# Patient Record
Sex: Female | Born: 1935 | Race: White | Hispanic: No | Smoking: Former smoker
Health system: Southern US, Community
[De-identification: ages and names within clinical notes are randomized; demographics above are authoritative.]

## PROBLEM LIST (undated history)

## (undated) DIAGNOSIS — E785 Hyperlipidemia, unspecified: Secondary | ICD-10-CM

## (undated) DIAGNOSIS — I4891 Unspecified atrial fibrillation: Secondary | ICD-10-CM

## (undated) DIAGNOSIS — I639 Cerebral infarction, unspecified: Secondary | ICD-10-CM

## (undated) DIAGNOSIS — F419 Anxiety disorder, unspecified: Secondary | ICD-10-CM

## (undated) DIAGNOSIS — N189 Chronic kidney disease, unspecified: Secondary | ICD-10-CM

## (undated) DIAGNOSIS — I050 Rheumatic mitral stenosis: Secondary | ICD-10-CM

## (undated) DIAGNOSIS — I509 Heart failure, unspecified: Secondary | ICD-10-CM

## (undated) DIAGNOSIS — I499 Cardiac arrhythmia, unspecified: Secondary | ICD-10-CM

## (undated) DIAGNOSIS — M1990 Unspecified osteoarthritis, unspecified site: Secondary | ICD-10-CM

## (undated) DIAGNOSIS — F329 Major depressive disorder, single episode, unspecified: Secondary | ICD-10-CM

## (undated) DIAGNOSIS — F32A Depression, unspecified: Secondary | ICD-10-CM

## (undated) DIAGNOSIS — I10 Essential (primary) hypertension: Secondary | ICD-10-CM

## (undated) HISTORY — PX: JOINT REPLACEMENT: SHX530

## (undated) HISTORY — PX: HIP FRACTURE SURGERY: SHX118

## (undated) HISTORY — DX: Heart failure, unspecified: I50.9

## (undated) HISTORY — PX: ABDOMINAL HYSTERECTOMY: SHX81

## (undated) HISTORY — PX: FRACTURE SURGERY: SHX138

## (undated) HISTORY — PX: REPLACEMENT TOTAL KNEE BILATERAL: SUR1225

---

## 2013-06-08 ENCOUNTER — Inpatient Hospital Stay: Payer: Self-pay | Admitting: Internal Medicine

## 2013-06-08 LAB — CBC
HCT: 39.1 % (ref 35.0–47.0)
HGB: 13.1 g/dL (ref 12.0–16.0)
MCH: 29.5 pg (ref 26.0–34.0)
MCHC: 33.4 g/dL (ref 32.0–36.0)
MCV: 88 fL (ref 80–100)
Platelet: 241 10*3/uL (ref 150–440)
RBC: 4.44 10*6/uL (ref 3.80–5.20)
RDW: 14 % (ref 11.5–14.5)
WBC: 10 10*3/uL (ref 3.6–11.0)

## 2013-06-08 LAB — COMPREHENSIVE METABOLIC PANEL
ALK PHOS: 68 U/L
ANION GAP: 7 (ref 7–16)
Albumin: 3.9 g/dL (ref 3.4–5.0)
BILIRUBIN TOTAL: 1.1 mg/dL — AB (ref 0.2–1.0)
BUN: 30 mg/dL — ABNORMAL HIGH (ref 7–18)
CHLORIDE: 107 mmol/L (ref 98–107)
Calcium, Total: 9.3 mg/dL (ref 8.5–10.1)
Co2: 25 mmol/L (ref 21–32)
Creatinine: 1.08 mg/dL (ref 0.60–1.30)
EGFR (African American): 56 — ABNORMAL LOW
EGFR (Non-African Amer.): 48 — ABNORMAL LOW
Glucose: 92 mg/dL (ref 65–99)
Osmolality: 283 (ref 275–301)
Potassium: 4.5 mmol/L (ref 3.5–5.1)
SGOT(AST): 37 U/L (ref 15–37)
SGPT (ALT): 28 U/L (ref 12–78)
SODIUM: 139 mmol/L (ref 136–145)
Total Protein: 7.3 g/dL (ref 6.4–8.2)

## 2013-06-08 LAB — APTT
ACTIVATED PTT: 153.4 s — AB (ref 23.6–35.9)
Activated PTT: 29 secs (ref 23.6–35.9)

## 2013-06-08 LAB — PROTIME-INR
INR: 1.2
Prothrombin Time: 14.6 secs (ref 11.5–14.7)

## 2013-06-08 LAB — CK TOTAL AND CKMB (NOT AT ARMC)
CK, Total: 73 U/L
CK-MB: 2.3 ng/mL (ref 0.5–3.6)

## 2013-06-08 LAB — URINALYSIS, COMPLETE
Bilirubin,UR: NEGATIVE
Blood: NEGATIVE
Glucose,UR: NEGATIVE mg/dL (ref 0–75)
Nitrite: NEGATIVE
PROTEIN: NEGATIVE
Ph: 6 (ref 4.5–8.0)
RBC,UR: 5 /HPF (ref 0–5)
Specific Gravity: 1.018 (ref 1.003–1.030)
Squamous Epithelial: 1

## 2013-06-08 LAB — TROPONIN I
TROPONIN-I: 0.26 ng/mL — AB
Troponin-I: 0.21 ng/mL — ABNORMAL HIGH
Troponin-I: 0.24 ng/mL — ABNORMAL HIGH

## 2013-06-08 LAB — TSH: Thyroid Stimulating Horm: 2.06 u[IU]/mL

## 2013-06-09 LAB — CBC WITH DIFFERENTIAL/PLATELET
BASOS ABS: 0.1 10*3/uL (ref 0.0–0.1)
BASOS PCT: 0.8 %
Eosinophil #: 0.2 10*3/uL (ref 0.0–0.7)
Eosinophil %: 1.9 %
HCT: 35.5 % (ref 35.0–47.0)
HGB: 12.1 g/dL (ref 12.0–16.0)
Lymphocyte #: 1 10*3/uL (ref 1.0–3.6)
Lymphocyte %: 11.1 %
MCH: 29.8 pg (ref 26.0–34.0)
MCHC: 34.1 g/dL (ref 32.0–36.0)
MCV: 87 fL (ref 80–100)
Monocyte #: 0.7 x10 3/mm (ref 0.2–0.9)
Monocyte %: 7.1 %
NEUTROS PCT: 79.1 %
Neutrophil #: 7.4 10*3/uL — ABNORMAL HIGH (ref 1.4–6.5)
Platelet: 203 10*3/uL (ref 150–440)
RBC: 4.07 10*6/uL (ref 3.80–5.20)
RDW: 13.6 % (ref 11.5–14.5)
WBC: 9.3 10*3/uL (ref 3.6–11.0)

## 2013-06-09 LAB — BASIC METABOLIC PANEL
Anion Gap: 9 (ref 7–16)
BUN: 25 mg/dL — ABNORMAL HIGH (ref 7–18)
CHLORIDE: 107 mmol/L (ref 98–107)
CO2: 23 mmol/L (ref 21–32)
CREATININE: 0.96 mg/dL (ref 0.60–1.30)
Calcium, Total: 8.6 mg/dL (ref 8.5–10.1)
EGFR (African American): >60
EGFR (Non-African Amer.): 55 — ABNORMAL LOW
Glucose: 111 mg/dL — ABNORMAL HIGH (ref 65–99)
Osmolality: 283 (ref 275–301)
Potassium: 4.1 mmol/L (ref 3.5–5.1)
SODIUM: 139 mmol/L (ref 136–145)

## 2013-06-09 LAB — PROTIME-INR
INR: 1.2
Prothrombin Time: 15 secs — ABNORMAL HIGH (ref 11.5–14.7)

## 2013-06-09 LAB — APTT: Activated PTT: 96 secs — ABNORMAL HIGH (ref 23.6–35.9)

## 2013-11-26 ENCOUNTER — Ambulatory Visit: Payer: Self-pay | Admitting: Orthopedic Surgery

## 2014-06-04 NOTE — H&P (Signed)
PATIENT NAME:  Theresa Carrillo, KOPPEL MR#:  [id number] DATE OF BIRTH:  23-Jan-1930  DATE OF ADMISSION:  06/08/2013  PRIMARY CARE PHYSICIAN: Dr. Candiss Norse with [facility].   PRIMARY CARDIOLOGIST: Dwayne D. Clayborn Bigness, MD, with [facility].   REFERRING EMERGENCY ROOM PHYSICIAN: Lennette Bihari A. Paduchowski, MD  CHIEF COMPLAINT: Weakness and fast heart rate.    HISTORY OF PRESENT ILLNESS: An 81 year old female who is living very active and independent life, has history of high blood pressure, was on Coumadin for some mild valvular stenosis. For the last 2 to 3 days she had complaint of weakness. She says her usual is doing 1 hour exercises every day and she is walking almost 1 mile every day. For the last 2 to 3 days, she is feeling excessively tired and weak that she can even not walk room to room. Mainly, her muscles felt so heavy and tired in her legs that she did not want to walk much. On further questioning, she states that she had some heaviness in her chest which was retrosternal, like pressure, but denies any palpitation or shortness of breath. She denies any injuries or fever or chills. Denies any change in her urinary symptoms. She has overactive bladder and has to go to the bathroom almost every hour or 2.  There is no change in that one. She had a regular scheduled appointment today with Dr. Etta Quill office so she did not call any other doctor and just decided to wait. Today, when she saw Dr. Clayborn Bigness in the office, they found she has tachycardia ranging up to 160 and 170 with atrial fibrillation and so he sent her into the Emergency Room and spoke to the emergency doctor that he will start the patient on hepatin IV drip and will do cardiac catheterization tomorrow. Hospitalist service was contacted to admit the patient.   REVIEW OF SYSTEMS:    CONSTITUTIONAL: Negative for fever. Positive for fatigue and generalized weakness. No pain or weight loss.  EYES: No blurring, double vision, discharge or  redness.  EARS, NOSE, THROAT: No tinnitus, ear pain or hearing loss.  RESPIRATORY: No cough, wheezing, hemoptysis or shortness of breath.  CARDIOVASCULAR: No chest pain, orthopnea, edema, arrhythmia or palpitations.  GASTROINTESTINAL: No nausea, vomiting, diarrhea, abdominal pain.  GENITOURINARY: No dysuria or hematuria. Increased frequency.  ENDOCRINE: No heat or cold intolerance.  SKIN:  No acne or rashes on the skin.  MUSCULOSKELETAL: No pain but has excessive weakness.  NEUROLOGICAL: No numbness. Has generalized weakness and tired. No tremor or vertigo.  PSYCHIATRIC: No anxiety, insomnia, bipolar disorder.   PAST MEDICAL HISTORY:  Hypertension, mitral valve stenosis, on Coumadin.   SOCIAL HISTORY: No smoking, no drinking. No alcohol use. 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 Currently, she is in independent living facility   FAMILY HISTORY: Positive for myocardial infarction in brother at 42 and mother at 56.  HOME MEDICATIONS:  1.  Warfarin 2.5 mg oral tablet once a day.  2.  Vitamin D3 5000 international units  2 times a week.  3.  Vitamin C 500 mg oral tablet once a day.  4.  Multivitamin tablet once a day.  5.  Doxepin 10 mg oral tablet 3 times a day.  6.  Calcium carbonate 1250 mg oral tablet once a day.  7.  Atenolol 50 mg oral tablet once a day.   PHYSICAL EXAMINATION: VITAL SIGNS:  In ER, temperature 98, blood pressure 102/67, heart rate was initially 140  and 150 when presented to the Emergency Room, currently it is 110 to 120 range. She was given 2 times injection of Cardizem 10 mg by ER physician. Oxygen saturation 97% on room air.  GENERAL: The patient is fully alert and oriented to time, place and person. Does not appear in any acute distress.  HEENT: Head and neck atraumatic. Conjunctiva pink. Oral mucosa moist.  NECK: Supple. No JVD.  RESPIRATORY: Bilateral clear and equal air entry.  CARDIOVASCULAR: S1, S2 present, irregular, no  murmur.  ABDOMEN: Soft, nontender. Bowel sounds present. No organomegaly.  SKIN: No rashes.  LEGS: No edema.  NEUROLOGICAL: Power 5 out of 5. Follows commands. Moves all 4 limbs. No tremor.  JOINTS: No swelling or tenderness.  PSYCHIATRIC: Does not appear in any acute psychiatric illness at this time.   LABORATORY, DIAGNOSTIC AND RADIOLOGICAL DATA:   1.  Glucose 92, BUN 30, creatinine 1.08, sodium 139, potassium 4.5, chloride is 107 and CO2 is 25, calcium is 9.3.  2.  Total protein 7.3, albumin 3.9, bilirubin 1.1, alkaline phosphatase 68, SGOT 37, SGPT 28.  3.  Troponin is 0.21, CK-MB is 2.3.  4.  WBC is 10, hemoglobin is 13.1, platelet count is 241, MCV is 88.  5.  INR is 1.2.  6.  Urinalysis is having 2+ leukocyte esterase and 15 WBC, nitrite negative.  7.  Chest x-ray, portable, shows no acute cardiopulmonary disease.  8.  EKG in the ER shows atrial fibrillation with heart rate ranging up to 120.   ASSESSMENT AND PLAN: An 81 year old female with history of hypertension and mitral valve stenosis, on Coumadin therapy, sent to Emergency Room by Dr. Clayborn Bigness for having atrial fibrillation with rapid ventricular response.  1.  Atrial fibrillation with rapid ventricular response. The patient is given injection Cardizem 2 times by ER physician which slowed down heart rate from 140 and 150s to 110.  I will start her on oral Cardizem 30 mg every 6 hours for better and constant heart rate control. Dr. Clayborn Bigness to follow for further management of this issue. I will check TSH level to find out the cause of this.  2.  Elevated troponin. This might be a result of atrial fibrillation with rapid ventricular response. We will admit to telemetry and follow serial troponins. She will be started on hepatin IV drip for atrial fibrillation and cardiac catheterization will be done tomorrow by Dr. Clayborn Bigness.  3.  White blood cells in urine. The patient says she had overactive bladder. She always had WBCs in her urine  and was treated multiple times in the past for urinary tract infection. She saw a specialist and he did cystoscopy and told her that she has overactive bladder and does not need to be treated for urinary tract infection unless she has symptoms. Currently, she denied any symptoms so I would like not to start on any antibiotics at this time.  4.  History of hypertension. Currently blood pressure is under control so I would like not to start on any blood pressure medication at this time. We will continue monitoring.  5.  CODE STATUS:  Full code.   TOTAL TIME SPENT ON THIS ADMISSION: 50 minutes.    ____________________________ Ceasar Lund Anselm Jungling, MD vgv:cs D: 06/08/2013 14:24:00 ET T: 06/08/2013 14:54:40 ET JOB#: [id number]  cc: Ceasar Lund. Anselm Jungling, MD, <Dictator> Dr. Candiss Norse, [facility] MD ELECTRONICALLY SIGNED 06/21/2013 22:15

## 2014-06-04 NOTE — Consult Note (Signed)
PATIENT NAME:  Theresa Carrillo, Theresa Carrillo MR#:  [id number] DATE OF BIRTH:  01/16/1930  DATE OF CONSULTATION:  06/09/2013  CONSULTING PHYSICIAN:  Chau Savell D. Clayborn Bigness, MD  PRIMARY CARE PHYSICIAN: Dr. Candiss Norse.  INDICATION: Unstable angina.    HISTORY OF PRESENT ILLNESS: The patient is an 81 year old female, lives in independent living, has a history (, history of chest pain, angina, weakness, fatigue. She has a history of transient ischemic attack has been on Coumadin and has trouble with her Coumadin dose. The patient started having trouble with angina with any activity. Finally came to the office for evaluation with dyspnea, weakness, fatigue, heart rates of 150 and irregular. The patient was seen in the office by cardiology and then sent to the Emergency Room for evaluation and admission for unstable angina and rapid atrial fibrillation.   REVIEW OF SYSTEMS: No blackout spells or syncope. No nausea or vomiting. No fever. No chills. No sweats. No weight loss, weight gain. No hemoptysis or hematemesis. No bright red blood per rectum. No vision change, hearing change. No sputum production or cough. She has had coagulopathy. She has had trouble with chest pain and chest angina, shortness of breath.   PAST MEDICAL HISTORY: Hypertension, mitral valve disease, transient ischemic attack, hypertension.   SOCIAL HISTORY: Lives alone. 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 Lives independently. No smoking or alcohol consumption.   FAMILY HISTORY: Myocardial infarction.   MEDICATIONS: She was on Coumadin 2.5 daily, vitamin D, vitamin C, multivitamins. She is on doxepin 10 mg 2 times a day, calcium once a day,  atenolol 50 mg a day.   PHYSICAL EXAMINATION: VITAL SIGNS: Blood pressure 110/70, pulse of 140, respiratory rate 16, afebrile.  HEENT: Normocephalic, atraumatic. Pupils equal, reactive to light.  NECK: Supple. No significant JVD, bruits or adenopathy.   LUNGS: Clear to auscultation and percussion. No  significant wheeze, rhonchi, or rale.  HEART: Irregularly irregular. Tachycardic with systolic ejection murmur at the apex. PMI nondisplaced.  ABDOMEN: Benign.  EXTREMITIES: Within normal limits. NEUROLOGIC: Intact.  SKIN EXAM: Normal.   LABORATORIES: Glucose of 92, BUN of 30, creatinine 1.08, sodium 139, potassium 4.5, chloride 107, CO2 25. LFTs negative. Troponin 0.21. White count 10, hemoglobin 13, platelet count 241, INR 1.2. Urinalysis is essentially negative. Chest x-ray negative.   EKG: Rapid atrial fibrillation, rate of about 130, nonspecific findings.   ASSESSMENT:  1. Rapid atrial fibrillation, unstable angina. 2. White cells in the urine.  3. Hypertension.  4. Murmur, mitral valve disease.  5. History of transient ischemic attack.   PLAN: 1. Recommend treatment for atrial fibrillation, rate control with Cardizem. Increase atenolol. Consider antiarrhythmic, place on anticoagulation short-term with heparin in the interim.  2.  No indication for cardioversion at this stage.  3. Unstable angina. Since her INR is okay, would probably consider cardiac catheterization in the morning for evaluation of ischemia with EKG initially with ST segment changes and chest discomfort. It may be all related to her heart rate and atrial fibrillation, but she has had progressive symptoms and unstable angina, should be investigated with a catheterization.  4. Hypertension. Continue hypertension control with atenolol. We will add Cardizem.  5. Consider urine therapy with antibiotics. Consider whether she needs neurology evaluation, but no evidence of a transient ischemic attack at this point. Recommend modest weight loss and exercise. Continue treatment for bladder disorder.   Proceed with cardiac catheterization and base further evaluation on results of study.  ____________________________ Loran Senters Clayborn Bigness, MD ddc:sg D: 06/09/2013  11:28:44 ET T: 06/09/2013 13:02:17 ET JOB#: [id number]  cc: Cristhian Vanhook  D. Clayborn Bigness, MD, <Dictator> Yolonda Kida MD ELECTRONICALLY SIGNED 07/07/2013 16:27

## 2015-11-16 ENCOUNTER — Other Ambulatory Visit: Payer: Self-pay | Admitting: Orthopedic Surgery

## 2015-11-16 DIAGNOSIS — M48062 Spinal stenosis, lumbar region with neurogenic claudication: Secondary | ICD-10-CM

## 2015-11-16 DIAGNOSIS — M47816 Spondylosis without myelopathy or radiculopathy, lumbar region: Secondary | ICD-10-CM

## 2015-11-16 DIAGNOSIS — G9519 Other vascular myelopathies: Secondary | ICD-10-CM

## 2015-11-16 DIAGNOSIS — M5136 Other intervertebral disc degeneration, lumbar region: Secondary | ICD-10-CM

## 2015-11-29 ENCOUNTER — Ambulatory Visit: Payer: Self-pay

## 2015-12-01 ENCOUNTER — Ambulatory Visit
Admission: RE | Admit: 2015-12-01 | Discharge: 2015-12-01 | Disposition: A | Discharge status: Home / Self Care | Payer: Medicare Other | Source: Ambulatory Visit | Attending: Orthopedic Surgery | Admitting: Orthopedic Surgery

## 2015-12-01 ENCOUNTER — Encounter: Payer: Self-pay | Admitting: Radiology

## 2015-12-01 DIAGNOSIS — M5136 Other intervertebral disc degeneration, lumbar region: Secondary | ICD-10-CM

## 2015-12-01 DIAGNOSIS — M5137 Other intervertebral disc degeneration, lumbosacral region: Secondary | ICD-10-CM | POA: Diagnosis not present

## 2015-12-01 DIAGNOSIS — G9519 Other vascular myelopathies: Secondary | ICD-10-CM

## 2015-12-01 DIAGNOSIS — M47816 Spondylosis without myelopathy or radiculopathy, lumbar region: Secondary | ICD-10-CM | POA: Diagnosis not present

## 2015-12-01 DIAGNOSIS — M48062 Spinal stenosis, lumbar region with neurogenic claudication: Secondary | ICD-10-CM | POA: Diagnosis not present

## 2015-12-01 DIAGNOSIS — M4317 Spondylolisthesis, lumbosacral region: Secondary | ICD-10-CM | POA: Insufficient documentation

## 2016-06-26 ENCOUNTER — Emergency Department: Payer: Medicare Other

## 2016-06-26 ENCOUNTER — Inpatient Hospital Stay
Admission: EM | Admit: 2016-06-26 | Discharge: 2016-06-28 | DRG: 291 | Disposition: A | Discharge status: Home / Self Care | Payer: Medicare Other | Attending: Internal Medicine | Admitting: Internal Medicine

## 2016-06-26 DIAGNOSIS — I110 Hypertensive heart disease with heart failure: Principal | ICD-10-CM | POA: Diagnosis present

## 2016-06-26 DIAGNOSIS — Z7901 Long term (current) use of anticoagulants: Secondary | ICD-10-CM

## 2016-06-26 DIAGNOSIS — I5021 Acute systolic (congestive) heart failure: Secondary | ICD-10-CM | POA: Diagnosis present

## 2016-06-26 DIAGNOSIS — E785 Hyperlipidemia, unspecified: Secondary | ICD-10-CM | POA: Diagnosis present

## 2016-06-26 DIAGNOSIS — Z66 Do not resuscitate: Secondary | ICD-10-CM | POA: Diagnosis present

## 2016-06-26 DIAGNOSIS — I482 Chronic atrial fibrillation: Secondary | ICD-10-CM | POA: Diagnosis present

## 2016-06-26 DIAGNOSIS — I10 Essential (primary) hypertension: Secondary | ICD-10-CM | POA: Diagnosis present

## 2016-06-26 DIAGNOSIS — J9601 Acute respiratory failure with hypoxia: Secondary | ICD-10-CM | POA: Diagnosis present

## 2016-06-26 DIAGNOSIS — J189 Pneumonia, unspecified organism: Secondary | ICD-10-CM | POA: Diagnosis present

## 2016-06-26 DIAGNOSIS — I480 Paroxysmal atrial fibrillation: Secondary | ICD-10-CM | POA: Diagnosis present

## 2016-06-26 DIAGNOSIS — F419 Anxiety disorder, unspecified: Secondary | ICD-10-CM | POA: Diagnosis present

## 2016-06-26 DIAGNOSIS — I5033 Acute on chronic diastolic (congestive) heart failure: Secondary | ICD-10-CM | POA: Diagnosis present

## 2016-06-26 DIAGNOSIS — Z8673 Personal history of transient ischemic attack (TIA), and cerebral infarction without residual deficits: Secondary | ICD-10-CM

## 2016-06-26 DIAGNOSIS — J811 Chronic pulmonary edema: Secondary | ICD-10-CM

## 2016-06-26 DIAGNOSIS — I509 Heart failure, unspecified: Secondary | ICD-10-CM

## 2016-06-26 DIAGNOSIS — Z96653 Presence of artificial knee joint, bilateral: Secondary | ICD-10-CM | POA: Diagnosis present

## 2016-06-26 HISTORY — DX: Essential (primary) hypertension: I10

## 2016-06-26 HISTORY — DX: Cerebral infarction, unspecified: I63.9

## 2016-06-26 HISTORY — DX: Unspecified atrial fibrillation: I48.91

## 2016-06-26 HISTORY — DX: Hyperlipidemia, unspecified: E78.5

## 2016-06-26 HISTORY — DX: Anxiety disorder, unspecified: F41.9

## 2016-06-26 LAB — CBC WITH DIFFERENTIAL/PLATELET
Basophils Absolute: 0.1 10*3/uL (ref 0–0.1)
Basophils Relative: 1 %
EOS ABS: 0.1 10*3/uL (ref 0–0.7)
EOS PCT: 1 %
HCT: 35.3 % (ref 35.0–47.0)
Hemoglobin: 12.3 g/dL (ref 12.0–16.0)
LYMPHS ABS: 0.5 10*3/uL — AB (ref 1.0–3.6)
Lymphocytes Relative: 5 %
MCH: 29.4 pg (ref 26.0–34.0)
MCHC: 34.8 g/dL (ref 32.0–36.0)
MCV: 84.5 fL (ref 80.0–100.0)
MONO ABS: 0.7 10*3/uL (ref 0.2–0.9)
MONOS PCT: 7 %
Neutro Abs: 8.6 10*3/uL — ABNORMAL HIGH (ref 1.4–6.5)
Neutrophils Relative %: 86 %
PLATELETS: 193 10*3/uL (ref 150–440)
RBC: 4.17 MIL/uL (ref 3.80–5.20)
RDW: 13.4 % (ref 11.5–14.5)
WBC: 9.9 10*3/uL (ref 3.6–11.0)

## 2016-06-26 LAB — COMPREHENSIVE METABOLIC PANEL
ALT: 14 U/L (ref 14–54)
ANION GAP: 11 (ref 5–15)
AST: 25 U/L (ref 15–41)
Albumin: 4.1 g/dL (ref 3.5–5.0)
Alkaline Phosphatase: 56 U/L (ref 38–126)
BILIRUBIN TOTAL: 1.7 mg/dL — AB (ref 0.3–1.2)
BUN: 19 mg/dL (ref 6–20)
CALCIUM: 8.6 mg/dL — AB (ref 8.9–10.3)
CO2: 26 mmol/L (ref 22–32)
Chloride: 97 mmol/L — ABNORMAL LOW (ref 101–111)
Creatinine, Ser: 0.89 mg/dL (ref 0.44–1.00)
GFR calc Af Amer: >60 mL/min (ref >60)
GFR, EST NON AFRICAN AMERICAN: 57 mL/min — AB (ref >60)
Glucose, Bld: 135 mg/dL — ABNORMAL HIGH (ref 65–99)
Potassium: 3.4 mmol/L — ABNORMAL LOW (ref 3.5–5.1)
Sodium: 134 mmol/L — ABNORMAL LOW (ref 135–145)
TOTAL PROTEIN: 6.8 g/dL (ref 6.5–8.1)

## 2016-06-26 LAB — LACTIC ACID, PLASMA: LACTIC ACID, VENOUS: 1 mmol/L (ref 0.5–1.9)

## 2016-06-26 LAB — TROPONIN I: Troponin I: <0.03 ng/mL (ref <0.03)

## 2016-06-26 LAB — LIPASE, BLOOD: LIPASE: 23 U/L (ref 11–51)

## 2016-06-26 LAB — CK: Total CK: 35 U/L — ABNORMAL LOW (ref 38–234)

## 2016-06-26 LAB — PROTIME-INR
INR: 1.83
PROTHROMBIN TIME: 21.4 s — AB (ref 11.4–15.2)

## 2016-06-26 LAB — BRAIN NATRIURETIC PEPTIDE: B Natriuretic Peptide: 1088 pg/mL — ABNORMAL HIGH (ref 0.0–100.0)

## 2016-06-26 MED ORDER — IPRATROPIUM-ALBUTEROL 0.5-2.5 (3) MG/3ML IN SOLN
3.0000 mL | Freq: Once | RESPIRATORY_TRACT | Status: AC
  Administered 2016-06-26: 3 mL via RESPIRATORY_TRACT
  Filled 2016-06-26: qty 3

## 2016-06-26 MED ORDER — IPRATROPIUM-ALBUTEROL 0.5-2.5 (3) MG/3ML IN SOLN
3.0000 mL | Freq: Once | RESPIRATORY_TRACT | Status: AC
Start: 2016-06-26 — End: 2016-06-26
  Administered 2016-06-26: 3 mL via RESPIRATORY_TRACT
  Filled 2016-06-26: qty 3

## 2016-06-26 NOTE — ED Notes (Signed)
C-collar removed per Dr. Karma Greaser.

## 2016-06-26 NOTE — ED Triage Notes (Signed)
Pt arrives via [facility] from [facility] after a fall. Pt was found on floor, EMS states she doesn't know how she got there. She hit her life alert. Pt states she slid out of bed slowly. Pt c/o of neck and back pain. Pt arrived in c-collar. Pt was wheezing with EMS, they gave 1 albuterol. EMS reports they don't think pt wears oxygen, no signs of oxygen. Pt was 80% on RA, pt placed on 2 L nasal cannula in mid 90's. Hx a fib, HTN. Arrives with DNR. Pt does use blood thinners, is on warfarin.

## 2016-06-27 ENCOUNTER — Inpatient Hospital Stay
Admit: 2016-06-27 | Discharge: 2016-06-27 | Disposition: A | Discharge status: Home / Self Care | Payer: Medicare Other | Attending: Internal Medicine | Admitting: Internal Medicine

## 2016-06-27 ENCOUNTER — Encounter: Payer: Self-pay | Admitting: Internal Medicine

## 2016-06-27 DIAGNOSIS — I5021 Acute systolic (congestive) heart failure: Secondary | ICD-10-CM | POA: Diagnosis present

## 2016-06-27 DIAGNOSIS — J189 Pneumonia, unspecified organism: Secondary | ICD-10-CM | POA: Diagnosis present

## 2016-06-27 DIAGNOSIS — I480 Paroxysmal atrial fibrillation: Secondary | ICD-10-CM | POA: Diagnosis present

## 2016-06-27 DIAGNOSIS — Z96653 Presence of artificial knee joint, bilateral: Secondary | ICD-10-CM | POA: Diagnosis present

## 2016-06-27 DIAGNOSIS — J9601 Acute respiratory failure with hypoxia: Secondary | ICD-10-CM | POA: Diagnosis present

## 2016-06-27 DIAGNOSIS — Z7901 Long term (current) use of anticoagulants: Secondary | ICD-10-CM | POA: Diagnosis not present

## 2016-06-27 DIAGNOSIS — Z8673 Personal history of transient ischemic attack (TIA), and cerebral infarction without residual deficits: Secondary | ICD-10-CM | POA: Diagnosis not present

## 2016-06-27 DIAGNOSIS — Z66 Do not resuscitate: Secondary | ICD-10-CM | POA: Diagnosis present

## 2016-06-27 DIAGNOSIS — F419 Anxiety disorder, unspecified: Secondary | ICD-10-CM | POA: Diagnosis present

## 2016-06-27 DIAGNOSIS — I5033 Acute on chronic diastolic (congestive) heart failure: Secondary | ICD-10-CM | POA: Diagnosis present

## 2016-06-27 DIAGNOSIS — I110 Hypertensive heart disease with heart failure: Secondary | ICD-10-CM | POA: Diagnosis present

## 2016-06-27 DIAGNOSIS — I482 Chronic atrial fibrillation: Secondary | ICD-10-CM | POA: Diagnosis present

## 2016-06-27 DIAGNOSIS — E785 Hyperlipidemia, unspecified: Secondary | ICD-10-CM | POA: Diagnosis present

## 2016-06-27 DIAGNOSIS — I10 Essential (primary) hypertension: Secondary | ICD-10-CM | POA: Diagnosis present

## 2016-06-27 LAB — CBC
HEMATOCRIT: 36.9 % (ref 35.0–47.0)
Hemoglobin: 12.9 g/dL (ref 12.0–16.0)
MCH: 29.6 pg (ref 26.0–34.0)
MCHC: 35 g/dL (ref 32.0–36.0)
MCV: 84.6 fL (ref 80.0–100.0)
Platelets: 199 10*3/uL (ref 150–440)
RBC: 4.37 MIL/uL (ref 3.80–5.20)
RDW: 13.6 % (ref 11.5–14.5)
WBC: 8.6 10*3/uL (ref 3.6–11.0)

## 2016-06-27 LAB — BASIC METABOLIC PANEL
Anion gap: 11 (ref 5–15)
BUN: 18 mg/dL (ref 6–20)
CHLORIDE: 96 mmol/L — AB (ref 101–111)
CO2: 31 mmol/L (ref 22–32)
CREATININE: 0.98 mg/dL (ref 0.44–1.00)
Calcium: 8.8 mg/dL — ABNORMAL LOW (ref 8.9–10.3)
GFR calc non Af Amer: 51 mL/min — ABNORMAL LOW (ref >60)
GFR, EST AFRICAN AMERICAN: 59 mL/min — AB (ref >60)
Glucose, Bld: 107 mg/dL — ABNORMAL HIGH (ref 65–99)
Potassium: 3.1 mmol/L — ABNORMAL LOW (ref 3.5–5.1)
SODIUM: 138 mmol/L (ref 135–145)

## 2016-06-27 LAB — ECHOCARDIOGRAM COMPLETE
Height: 60 in
Weight: 2441.6 oz

## 2016-06-27 LAB — MRSA PCR SCREENING: MRSA by PCR: NEGATIVE

## 2016-06-27 LAB — MAGNESIUM: Magnesium: 1.8 mg/dL (ref 1.7–2.4)

## 2016-06-27 LAB — TSH: TSH: 1.42 u[IU]/mL (ref 0.350–4.500)

## 2016-06-27 MED ORDER — ENOXAPARIN SODIUM 40 MG/0.4ML ~~LOC~~ SOLN: 40.0000 mg | SUBCUTANEOUS | Status: DC

## 2016-06-27 MED ORDER — FUROSEMIDE 10 MG/ML IJ SOLN
40.0000 mg | Freq: Once | INTRAMUSCULAR | Status: AC
  Administered 2016-06-27: 40 mg via INTRAVENOUS
  Filled 2016-06-27: qty 4

## 2016-06-27 MED ORDER — WARFARIN SODIUM 1 MG PO TABS
4.0000 mg | ORAL_TABLET | Freq: Once | ORAL | Status: AC
  Administered 2016-06-27: 4 mg via ORAL
  Filled 2016-06-27: qty 4

## 2016-06-27 MED ORDER — POTASSIUM CHLORIDE CRYS ER 20 MEQ PO TBCR
40.0000 meq | EXTENDED_RELEASE_TABLET | Freq: Every day | ORAL | Status: AC
  Administered 2016-06-27 – 2016-06-28 (×2): 40 meq via ORAL
  Filled 2016-06-27 (×2): qty 2

## 2016-06-27 MED ORDER — DOXEPIN HCL 10 MG PO CAPS
20.0000 mg | ORAL_CAPSULE | Freq: Every day | ORAL | Status: DC
  Administered 2016-06-27: 20 mg via ORAL
  Filled 2016-06-27 (×2): qty 2

## 2016-06-27 MED ORDER — METOPROLOL TARTRATE 25 MG PO TABS
25.0000 mg | ORAL_TABLET | Freq: Two times a day (BID) | ORAL | Status: DC
  Administered 2016-06-27 – 2016-06-28 (×3): 25 mg via ORAL
  Filled 2016-06-27 (×4): qty 1

## 2016-06-27 MED ORDER — ACETAMINOPHEN 325 MG PO TABS: 650.0000 mg | ORAL_TABLET | Freq: Four times a day (QID) | ORAL | Status: DC | PRN

## 2016-06-27 MED ORDER — WARFARIN - PHARMACIST DOSING INPATIENT
Freq: Every day | Status: DC
  Administered 2016-06-27: 17:00:00

## 2016-06-27 MED ORDER — ORAL CARE MOUTH RINSE
15.0000 mL | Freq: Two times a day (BID) | OROMUCOSAL | Status: DC
  Administered 2016-06-27: 15 mL via OROMUCOSAL

## 2016-06-27 MED ORDER — DOXEPIN HCL 10 MG PO CAPS
10.0000 mg | ORAL_CAPSULE | Freq: Two times a day (BID) | ORAL | Status: DC
  Administered 2016-06-27 – 2016-06-28 (×3): 10 mg via ORAL
  Filled 2016-06-27 (×3): qty 1

## 2016-06-27 MED ORDER — ONDANSETRON HCL 4 MG/2ML IJ SOLN: 4.0000 mg | Freq: Four times a day (QID) | INTRAMUSCULAR | Status: DC | PRN

## 2016-06-27 MED ORDER — DOXEPIN HCL 10 MG PO CAPS
10.0000 mg | ORAL_CAPSULE | Freq: Two times a day (BID) | ORAL | Status: DC
  Administered 2016-06-27: 10 mg via ORAL
  Filled 2016-06-27 (×2): qty 1

## 2016-06-27 MED ORDER — ONDANSETRON HCL 4 MG PO TABS: 4.0000 mg | ORAL_TABLET | Freq: Four times a day (QID) | ORAL | Status: DC | PRN

## 2016-06-27 MED ORDER — ACETAMINOPHEN 650 MG RE SUPP: 650.0000 mg | Freq: Four times a day (QID) | RECTAL | Status: DC | PRN

## 2016-06-27 MED ORDER — SOTALOL HCL 80 MG PO TABS
80.0000 mg | ORAL_TABLET | Freq: Two times a day (BID) | ORAL | Status: DC
  Administered 2016-06-27: 80 mg via ORAL
  Filled 2016-06-27 (×2): qty 1

## 2016-06-27 MED ORDER — SOTALOL HCL 80 MG PO TABS
80.0000 mg | ORAL_TABLET | Freq: Every day | ORAL | Status: DC
  Filled 2016-06-27: qty 1

## 2016-06-27 MED ORDER — FUROSEMIDE 10 MG/ML IJ SOLN
20.0000 mg | Freq: Two times a day (BID) | INTRAMUSCULAR | Status: DC
  Administered 2016-06-27 – 2016-06-28 (×2): 20 mg via INTRAVENOUS
  Filled 2016-06-27 (×2): qty 2

## 2016-06-27 MED ORDER — LOSARTAN POTASSIUM 25 MG PO TABS
25.0000 mg | ORAL_TABLET | Freq: Every day | ORAL | Status: DC
  Administered 2016-06-27: 25 mg via ORAL
  Filled 2016-06-27 (×2): qty 1

## 2016-06-27 NOTE — H&P (Signed)
[facility] [place] NAME: Theresa Carrillo    MR#:  [id number]  DATE OF BIRTH:  08-Aug-1929  DATE OF ADMISSION:  06/26/2016  PRIMARY CARE PHYSICIAN: System, Pcp Not In   REQUESTING/REFERRING PHYSICIAN: Karma Greaser, MD  CHIEF COMPLAINT:   Chief Complaint  Patient presents with  . Fall  . Respiratory Distress    HISTORY OF PRESENT ILLNESS:  Theresa Carrillo  is a 83 y.o. female who presents with Shortness of breath, mild lower showing swelling. Patient states that the shortness of breath has been going on for the past 4-5 days. She has no prior diagnosis of heart failure, she does have a diagnosis of A. fib. Here in the ED workup is consistent with acute heart failure, with pulmonary congestion on imaging and BNP greater than thousand. Hospitalists were called for admission.  PAST MEDICAL HISTORY:   Past Medical History:  Diagnosis Date  . Anxiety   . Atrial fibrillation ([place])   . HLD (hyperlipidemia)   . Hypertension   . Stroke [facility])     PAST SURGICAL HISTORY:   Past Surgical History:  Procedure Laterality Date  . ABDOMINAL HYSTERECTOMY    . HIP FRACTURE SURGERY    . REPLACEMENT TOTAL KNEE BILATERAL      SOCIAL HISTORY:   Social History  Substance Use Topics  . Smoking status: Never Smoker  . Smokeless tobacco: Not on file  . Alcohol use No    FAMILY HISTORY:   Family History  Problem Relation Age of Onset  . Heart failure Mother   . Asthma Father     DRUG ALLERGIES:  No Known Allergies  MEDICATIONS AT HOME:   Prior to Admission medications   Medication Sig Start Date End Date Taking? Authorizing Provider  atenolol (TENORMIN) 100 MG tablet Take 1 tablet by mouth daily. 04/20/16   [provider]  doxepin (SINEQUAN) 10 MG capsule Take 1 capsule by mouth 2 (two) times daily. 04/23/16   [provider]  hydrochlorothiazide (HYDRODIURIL) 12.5 MG tablet Take 1 tablet by mouth daily. 05/18/16    [provider]  losartan (COZAAR) 25 MG tablet Take 1 tablet by mouth daily. 05/18/16   [provider]  sotalol (BETAPACE) 80 MG tablet Take 1 tablet by mouth 2 (two) times daily. 04/23/16   [provider]  warfarin (COUMADIN) 1 MG tablet Take 1 tablet by mouth daily. 04/20/16   [provider]  warfarin (COUMADIN) 4 MG tablet  04/20/16   [provider]  zaleplon (SONATA) 5 MG capsule Take 1 capsule by mouth daily. 06/18/16   [provider]    REVIEW OF SYSTEMS:  Review of Systems  Constitutional: Negative for chills, fever, malaise/fatigue and weight loss.  HENT: Negative for ear pain, hearing loss and tinnitus.   Eyes: Negative for blurred vision, double vision, pain and redness.  Respiratory: Positive for cough and shortness of breath. Negative for hemoptysis.   Cardiovascular: Positive for leg swelling. Negative for chest pain, palpitations and orthopnea.  Gastrointestinal: Negative for abdominal pain, constipation, diarrhea, nausea and vomiting.  Genitourinary: Negative for dysuria, frequency and hematuria.  Musculoskeletal: Negative for back pain, joint pain and neck pain.  Skin:       No acne, rash, or lesions  Neurological: Negative for dizziness, tremors, focal weakness and weakness.  Endo/Heme/Allergies: Negative for polydipsia. Does not bruise/bleed easily.  Psychiatric/Behavioral: Negative for depression. The patient is not nervous/anxious and does not have insomnia.  VITAL SIGNS:   Vitals:   06/26/16 2130 06/26/16 2330 06/27/16 0000  BP: (!) 160/85 131/78 134/87  Pulse: 77 91 83  Resp: (!) 21 (!) 22 (!) 22  SpO2: 99% 98% 98%   Wt Readings from Last 3 Encounters:  No data found for Wt    PHYSICAL EXAMINATION:  Physical Exam  Vitals reviewed. Constitutional: She is oriented to person, place, and time. She appears well-developed and well-nourished. No distress.  HENT:  Head: Normocephalic and atraumatic.   Mouth/Throat: Oropharynx is clear and moist.  Eyes: Conjunctivae and EOM are normal. Pupils are equal, round, and reactive to light. No scleral icterus.  Neck: Normal range of motion. Neck supple. No JVD present. No thyromegaly present.  Cardiovascular: Normal rate, regular rhythm and intact distal pulses.  Exam reveals no gallop and no friction rub.   No murmur heard. Respiratory: Effort normal. No respiratory distress. She has wheezes. She has rales.  GI: Soft. Bowel sounds are normal. She exhibits no distension. There is no tenderness.  Musculoskeletal: Normal range of motion. She exhibits edema.  No arthritis, no gout  Lymphadenopathy:    She has no cervical adenopathy.  Neurological: She is alert and oriented to person, place, and time. No cranial nerve deficit.  No dysarthria, no aphasia  Skin: Skin is warm and dry. No rash noted. No erythema.  Psychiatric: She has a normal mood and affect. Her behavior is normal. Judgment and thought content normal.    LABORATORY PANEL:   CBC  Recent Labs Lab 06/26/16 2129  WBC 9.9  HGB 12.3  HCT 35.3  PLT 193   ------------------------------------------------------------------------------------------------------------------  Chemistries   Recent Labs Lab 06/26/16 2129  NA 134*  K 3.4*  CL 97*  CO2 26  GLUCOSE 135*  BUN 19  CREATININE 0.89  CALCIUM 8.6*  AST 25  ALT 14  ALKPHOS 56  BILITOT 1.7*   ------------------------------------------------------------------------------------------------------------------  Cardiac Enzymes  Recent Labs Lab 06/26/16 2129  TROPONINI <0.03   ------------------------------------------------------------------------------------------------------------------  RADIOLOGY:  Ct Head Wo Contrast  Result Date: 06/26/2016 CLINICAL DATA:  Fall, neck pain EXAM: CT HEAD WITHOUT CONTRAST CT CERVICAL SPINE WITHOUT CONTRAST TECHNIQUE: Multidetector CT imaging of the head and cervical spine was  performed following the standard protocol without intravenous contrast. Multiplanar CT image reconstructions of the cervical spine were also generated. COMPARISON:  None. FINDINGS: CT HEAD FINDINGS Brain: No acute territorial infarction, hemorrhage or intracranial mass. Moderate-to-marked global atrophy. Prominent ventricles felt secondary to atrophy. Mild small vessel ischemic changes of the periventricular white matter. Old appearing lacunar infarct in the left basal ganglia. Vascular: No hyperdense vessels.  Carotid artery calcifications. Skull:  No fracture or suspicious bone lesion. Sinuses/Orbits: No acute finding. Other: None CT CERVICAL SPINE FINDINGS Alignment: Reversal of cervical lordosis. Trace retrolisthesis of C4 on C5. Trace anterolisthesis of C3 on C4. Skull base and vertebrae: Craniovertebral junction is intact. Vertebral body heights are maintained. No fracture is seen. Soft tissues and spinal canal: No prevertebral fluid or swelling. No visible canal hematoma. Disc levels: Multilevel degenerative disc changes, moderate severe at C3-C4, C4-C5 and C5-C6. Large posterior disc osteophyte complex at C5-C6 and on the right side at C4-C5. Multilevel bilateral facet arthropathy. Severe bilateral foraminal stenosis at C5-C6 and on the right side at C4-C5. Upper chest: Negative. Other: Carotid artery calcifications. IMPRESSION: 1. No definite CT evidence for acute intracranial abnormality. 2. Reversal of cervical lordosis. No definite acute osseous abnormality. Electronically Signed   By: Madie Reno.D.  On: 06/26/2016 21:44   Ct Cervical Spine Wo Contrast  Result Date: 06/26/2016 CLINICAL DATA:  Fall, neck pain EXAM: CT HEAD WITHOUT CONTRAST CT CERVICAL SPINE WITHOUT CONTRAST TECHNIQUE: Multidetector CT imaging of the head and cervical spine was performed following the standard protocol without intravenous contrast. Multiplanar CT image reconstructions of the cervical spine were also generated.  COMPARISON:  None. FINDINGS: CT HEAD FINDINGS Brain: No acute territorial infarction, hemorrhage or intracranial mass. Moderate-to-marked global atrophy. Prominent ventricles felt secondary to atrophy. Mild small vessel ischemic changes of the periventricular white matter. Old appearing lacunar infarct in the left basal ganglia. Vascular: No hyperdense vessels.  Carotid artery calcifications. Skull:  No fracture or suspicious bone lesion. Sinuses/Orbits: No acute finding. Other: None CT CERVICAL SPINE FINDINGS Alignment: Reversal of cervical lordosis. Trace retrolisthesis of C4 on C5. Trace anterolisthesis of C3 on C4. Skull base and vertebrae: Craniovertebral junction is intact. Vertebral body heights are maintained. No fracture is seen. Soft tissues and spinal canal: No prevertebral fluid or swelling. No visible canal hematoma. Disc levels: Multilevel degenerative disc changes, moderate severe at C3-C4, C4-C5 and C5-C6. Large posterior disc osteophyte complex at C5-C6 and on the right side at C4-C5. Multilevel bilateral facet arthropathy. Severe bilateral foraminal stenosis at C5-C6 and on the right side at C4-C5. Upper chest: Negative. Other: Carotid artery calcifications. IMPRESSION: 1. No definite CT evidence for acute intracranial abnormality. 2. Reversal of cervical lordosis. No definite acute osseous abnormality. Electronically Signed   By: Donavan Foil M.D.   On: 06/26/2016 21:44   Dg Chest Portable 1 View  Result Date: 06/26/2016 CLINICAL DATA:  Fall EXAM: PORTABLE CHEST 1 VIEW COMPARISON:  06/08/2013 FINDINGS: Cardiomegaly with mild central vascular congestion. No focal consolidation or effusion. No pneumothorax. Old appearing right first and second rib deformity. Aortic atherosclerosis. Prominent mitral calcification. IMPRESSION: Moderate cardiomegaly with central vascular congestion. Electronically Signed   By: Donavan Foil M.D.   On: 06/26/2016 21:59    EKG:   Orders placed or performed  during the hospital encounter of 06/26/16  . ED EKG  . ED EKG    IMPRESSION AND PLAN:  Principal Problem:   Acute systolic CHF (congestive heart failure) (HCC) - no prior diagnosis of CHF, workup in the ED today is consistent with the same, we will treat her with IV Lasix, get an echocardiogram and a cardiology consult in the morning Active Problems:   AF (paroxysmal atrial fibrillation) ([place]) - continue home meds   HTN (hypertension) - stable, continue home medications   Anxiety - continue home dose anxiolytics   HLD (hyperlipidemia) - continue home meds  All the records are reviewed and case discussed with ED provider. Management plans discussed with the patient and/or family.  DVT PROPHYLAXIS: SubQ lovenox  GI PROPHYLAXIS: None  ADMISSION STATUS: Inpatient  CODE STATUS: DNR    Code Status Orders        Start     Ordered   06/27/16 0026  Do not attempt resuscitation/DNR  Continuous    Question Answer Comment  In the event of cardiac or respiratory ARREST Do not call a "code blue"   In the event of cardiac or respiratory ARREST Do not perform Intubation, CPR, defibrillation or ACLS   In the event of cardiac or respiratory ARREST Use medication by any route, position, wound care, and other measures to relive pain and suffering. May use oxygen, suction and manual treatment of airway obstruction as needed for comfort.  06/27/16 0025    Code Status History    Date Active Date Inactive Code Status Order ID Comments User Context   This patient has a current code status but no historical code status.    Advance Directive Documentation     Most Recent Value  Type of Advance Directive  Out of facility DNR (pink MOST or yellow form)  Pre-existing out of facility DNR order (yellow form or pink MOST form)  -  "MOST" Form in Place?  -      TOTAL TIME TAKING CARE OF THIS PATIENT: 45 minutes.   Wilena Tyndall [place] 06/27/2016, 12:41 AM  [facility]   Office  [phone]  CC: Primary care physician; System, Pcp Not In  Note:  This document was prepared using Dragon voice recognition software and may include unintentional dictation errors.

## 2016-06-27 NOTE — Progress Notes (Signed)
Pharmacy Note:  83 yo F with Scr 0.98  Est Crcl 36.4 ml/min.  Patient on Sotalol 80 mg po BID.  Per Manufacturer recommendations the dosing for Crcl 36 ml/min would be 80 mg po Q24h.  Discussed with Dr. Benjie Karvonen. Will transition Sotalol to 80 mg PO Q24h. Patient also on Atenolol at home which has been transitioned to Metoprolol as inpatient.  Cardiology consult pending.  Chinita Greenland PharmD Clinical Pharmacist 06/27/2016

## 2016-06-27 NOTE — Progress Notes (Signed)
[facility] NAME: Theresa Carrillo    MR#:  [id number]  DATE OF BIRTH:  [birth year]  SUBJECTIVE:   Patient here with SOB  States Symptoms have improved  REVIEW OF SYSTEMS:    Review of Systems  Constitutional: Negative for fever, chills weight loss HENT: Negative for ear pain, nosebleeds, congestion, facial swelling, rhinorrhea, neck pain, neck stiffness and ear discharge.   Respiratory: Negative for cough, shortness of breath, wheezing  Cardiovascular: Negative for chest pain, palpitations and leg swelling.  Gastrointestinal: Negative for heartburn, abdominal pain, vomiting, diarrhea or consitpation Genitourinary: Negative for dysuria, urgency, frequency, hematuria Musculoskeletal: Negative for back pain or joint pain Neurological: Negative for dizziness, seizures, syncope, focal weakness,  numbness and headaches.  Hematological: Does not bruise/bleed easily.  Psychiatric/Behavioral: Negative for hallucinations, confusion, dysphoric mood    Tolerating Diet: yes      DRUG ALLERGIES:  No Known Allergies  VITALS:  Blood pressure 115/70, pulse 75, temperature 99.2 F (37.3 C), temperature source Oral, resp. rate 16, height 5' (1.524 m), weight 69.2 kg (152 lb 9.6 oz), SpO2 99 %.  PHYSICAL EXAMINATION:  Constitutional: Appears well-developed and well-nourished. No distress. HENT: Normocephalic. Marland Kitchen Oropharynx is clear and moist.  Eyes: Conjunctivae and EOM are normal. PERRLA, no scleral icterus.  Neck: Normal ROM. Neck supple. No JVD. No tracheal deviation. CVS: Irregular, irregular S1/S2 +, no murmurs, no gallops, no carotid bruit.  Pulmonary: Effort and breath sounds normal, no stridor, rhonchi, wheezes, rales.  Abdominal: Soft. BS +,  no distension, tenderness, rebound or guarding.  Musculoskeletal: Normal range of motion. No edema and no tenderness.  Neuro: Alert. CN 2-12 grossly intact. No focal deficits. Skin: Skin is  warm and dry. No rash noted. Psychiatric: Normal mood and affect.      LABORATORY PANEL:   CBC  Recent Labs Lab 06/27/16 0450  WBC 8.6  HGB 12.9  HCT 36.9  PLT 199   ------------------------------------------------------------------------------------------------------------------  Chemistries   Recent Labs Lab 06/26/16 2129 06/27/16 0450  NA 134* 138  K 3.4* 3.1*  CL 97* 96*  CO2 26 31  GLUCOSE 135* 107*  BUN 19 18  CREATININE 0.89 0.98  CALCIUM 8.6* 8.8*  MG  --  1.8  AST 25  --   ALT 14  --   ALKPHOS 56  --   BILITOT 1.7*  --    ------------------------------------------------------------------------------------------------------------------  Cardiac Enzymes  Recent Labs Lab 06/26/16 2129  TROPONINI <0.03   ------------------------------------------------------------------------------------------------------------------  RADIOLOGY:  Ct Head Wo Contrast  Result Date: 06/26/2016 CLINICAL DATA:  Fall, neck pain EXAM: CT HEAD WITHOUT CONTRAST CT CERVICAL SPINE WITHOUT CONTRAST TECHNIQUE: Multidetector CT imaging of the head and cervical spine was performed following the standard protocol without intravenous contrast. Multiplanar CT image reconstructions of the cervical spine were also generated. COMPARISON:  None. FINDINGS: CT HEAD FINDINGS Brain: No acute territorial infarction, hemorrhage or intracranial mass. Moderate-to-marked global atrophy. Prominent ventricles felt secondary to atrophy. Mild small vessel ischemic changes of the periventricular white matter. Old appearing lacunar infarct in the left basal ganglia. Vascular: No hyperdense vessels.  Carotid artery calcifications. Skull:  No fracture or suspicious bone lesion. Sinuses/Orbits: No acute finding. Other: None CT CERVICAL SPINE FINDINGS Alignment: Reversal of cervical lordosis. Trace retrolisthesis of C4 on C5. Trace anterolisthesis of C3 on C4. Skull base and vertebrae: Craniovertebral junction is  intact. Vertebral body heights are maintained. No fracture is seen. Soft tissues and spinal canal: No prevertebral  fluid or swelling. No visible canal hematoma. Disc levels: Multilevel degenerative disc changes, moderate severe at C3-C4, C4-C5 and C5-C6. Large posterior disc osteophyte complex at C5-C6 and on the right side at C4-C5. Multilevel bilateral facet arthropathy. Severe bilateral foraminal stenosis at C5-C6 and on the right side at C4-C5. Upper chest: Negative. Other: Carotid artery calcifications. IMPRESSION: 1. No definite CT evidence for acute intracranial abnormality. 2. Reversal of cervical lordosis. No definite acute osseous abnormality. Electronically Signed   By: Donavan Foil M.D.   On: 06/26/2016 21:44   Ct Cervical Spine Wo Contrast  Result Date: 06/26/2016 CLINICAL DATA:  Fall, neck pain EXAM: CT HEAD WITHOUT CONTRAST CT CERVICAL SPINE WITHOUT CONTRAST TECHNIQUE: Multidetector CT imaging of the head and cervical spine was performed following the standard protocol without intravenous contrast. Multiplanar CT image reconstructions of the cervical spine were also generated. COMPARISON:  None. FINDINGS: CT HEAD FINDINGS Brain: No acute territorial infarction, hemorrhage or intracranial mass. Moderate-to-marked global atrophy. Prominent ventricles felt secondary to atrophy. Mild small vessel ischemic changes of the periventricular white matter. Old appearing lacunar infarct in the left basal ganglia. Vascular: No hyperdense vessels.  Carotid artery calcifications. Skull:  No fracture or suspicious bone lesion. Sinuses/Orbits: No acute finding. Other: None CT CERVICAL SPINE FINDINGS Alignment: Reversal of cervical lordosis. Trace retrolisthesis of C4 on C5. Trace anterolisthesis of C3 on C4. Skull base and vertebrae: Craniovertebral junction is intact. Vertebral body heights are maintained. No fracture is seen. Soft tissues and spinal canal: No prevertebral fluid or swelling. No visible canal  hematoma. Disc levels: Multilevel degenerative disc changes, moderate severe at C3-C4, C4-C5 and C5-C6. Large posterior disc osteophyte complex at C5-C6 and on the right side at C4-C5. Multilevel bilateral facet arthropathy. Severe bilateral foraminal stenosis at C5-C6 and on the right side at C4-C5. Upper chest: Negative. Other: Carotid artery calcifications. IMPRESSION: 1. No definite CT evidence for acute intracranial abnormality. 2. Reversal of cervical lordosis. No definite acute osseous abnormality. Electronically Signed   By: Donavan Foil M.D.   On: 06/26/2016 21:44   Dg Chest Portable 1 View  Result Date: 06/26/2016 CLINICAL DATA:  Fall EXAM: PORTABLE CHEST 1 VIEW COMPARISON:  06/08/2013 FINDINGS: Cardiomegaly with mild central vascular congestion. No focal consolidation or effusion. No pneumothorax. Old appearing right first and second rib deformity. Aortic atherosclerosis. Prominent mitral calcification. IMPRESSION: Moderate cardiomegaly with central vascular congestion. Electronically Signed   By: Donavan Foil M.D.   On: 06/26/2016 21:59     ASSESSMENT AND PLAN:   83 year old female with history of atrial fibrillation, hyperlipidemia and CVA who presents with shortness of breath.  1. Acute hypoxic respiratory failure in the setting of Acute CHF exacerbation: No prior history of CHF. Obtain cardiology consultation and echo cardiac and. Continue IV Lasix Monitor I and O Monitor daily weight Check TSH Wean oxygen   2. Chronic atrial fibrillation: Continue sotalol Continue Coumadin  3. Anxiety and depression: Continue doxepin  4. Essential hypertension: Will add metoprolol to outpatient regimen and hold atenolol. Continue losartan.   Management plans discussed with the patient and she is in agreement.  CODE STATUS: dnr  TOTAL TIME TAKING CARE OF THIS PATIENT: 30 minutes.   PT consult for disposition  POSSIBLE D/C 1-2 days, DEPENDING ON CLINICAL CONDITION.   Shrinika Blatz,  Eileen Croswell M.D on 06/27/2016 at 10:18 AM  Between 7am to 6pm - Pager - [phone] After 6pm go to [email or web address] - Proofreader  [employer]  [phone]  CC: Primary care physician; System, Pcp Not In  Note: This dictation was prepared with Dragon dictation along with smaller phrase technology. Any transcriptional errors that result from this process are unintentional.

## 2016-06-27 NOTE — Progress Notes (Signed)
ANTICOAGULATION CONSULT NOTE - Initial Consult  Pharmacy Consult for Warfarin Dosing Indication: atrial fibrillation  No Known Allergies  Patient Measurements: Height: 5' (152.4 cm) Weight: 152 lb 9.6 oz (69.2 kg) IBW/kg (Calculated) : 45.5 Heparin Dosing Weight:   Vital Signs: Temp: 98.9 F (37.2 C) (05/17 1148) Temp Source: Oral (05/17 1148) BP: 120/92 (05/17 1148) Pulse Rate: 73 (05/17 1148)  Labs:  Recent Labs  06/26/16 2129 06/27/16 0450  HGB 12.3 12.9  HCT 35.3 36.9  PLT 193 199  LABPROT 21.4*  --   INR 1.83  --   CREATININE 0.89 0.98  CKTOTAL 35*  --   TROPONINI <0.03  --     Estimated Creatinine Clearance: 36.4 mL/min (by C-G formula based on SCr of 0.98 mg/dL).   Medical History: Past Medical History:  Diagnosis Date  . Anxiety   . Atrial fibrillation ([place])   . HLD (hyperlipidemia)   . Hypertension   . Stroke [facility])     Medications:  Scheduled:  . doxepin  10 mg Oral BID  . furosemide  20 mg Intravenous BID  . losartan  25 mg Oral Daily  . mouth rinse  15 mL Mouth Rinse BID  . metoprolol tartrate  25 mg Oral BID  . potassium chloride  40 mEq Oral Daily  . sotalol  80 mg Oral BID  . warfarin  4 mg Oral ONCE-1800  . Warfarin - Pharmacist Dosing Inpatient   Does not apply q1800   Infusions:    Assessment: 83 yo F on Warfarin at home for Afib admitted with fall, SOB, acute CHF. Home (assisted living at [facility]) dose Warfarin: Warfarin 3 mg Tue, Thur, Sat, Sun Warfarin 4 mg Mon, Wed, Fri  5/16 INR (at 2129)= 1.83   NO Warfarin charted 5/17 No INR    Goal of Therapy:  INR 2-3 Monitor platelets by anticoagulation protocol: Yes   Plan:  Will give Warfarin 4 mg x 1 tonight.  Will f/u INR in am.    Ilee Randleman A 06/27/2016,12:09 PM

## 2016-06-27 NOTE — Care Management (Addendum)
Patient admitted with new onset congestive heart failure.  Is now on room air.  Resides at [facility].  she is able to perform her adls and walks to the dining room.  Heads up referral for CHF follow up to [facility] as this agency follows home health for this retirement community.  PCP is Glendon Axe and is current.  No issues accessing medical care or paying for medications.  PT consult is pending.  Anticipate discharge within next 24 hours.

## 2016-06-27 NOTE — ED Provider Notes (Signed)
[facility] Emergency Department Provider Note  ____________________________________________   First MD Initiated Contact with Patient 06/26/16 2108     (approximate)  I have reviewed the triage vital signs and the nursing notes.   HISTORY  Chief Complaint Fall and Respiratory Distress    HPI Theresa Carrillo is a 83 y.o. female with a history of chronic atrial fibrillation on warfarin who presents by EMS for evaluation after a fall at [facility] and who was found in respiratory distress. She is in the assisted living facility and used her life alert device after falling from bed onto the floor. She thinks she was only on the floor for a few minutes. She is complaining of a poster headache and neck pain as well as difficulty breathing. When EMS arrived they noted that she was short of breath and wheezing and had an SPO2 of 80% on room air. She has no history of lung diseaseor CHF and does not use oxygen. They placed her on 2 L of oxygen by nasal cannula and that improved her spO2 to the mid 90s.  A breathing treatment did not help her feel better. She states that the shortness of breath is worse when she lies flat and it is severe. Nothing else helps her breathing. She denies chest pain, abdominal pain, pain in her arms and legs, dysuria, fever/chills. She described her breathing issues as severe and gradually worsening over the last several days.  EMS brought her DNR paper with her.   Past Medical History:  Diagnosis Date  . Anxiety   . Atrial fibrillation ([place])   . HLD (hyperlipidemia)   . Hypertension   . Stroke [facility])     Patient Active Problem List   Diagnosis Date Noted  . Acute systolic CHF (congestive heart failure) ([place]) 06/27/2016  . AF (paroxysmal atrial fibrillation) ([place]) 06/27/2016  . Anxiety 06/27/2016  . HTN (hypertension) 06/27/2016  . HLD (hyperlipidemia) 06/27/2016    Past Surgical History:  Procedure Laterality Date  .  ABDOMINAL HYSTERECTOMY    . HIP FRACTURE SURGERY    . REPLACEMENT TOTAL KNEE BILATERAL      Prior to Admission medications   Medication Sig Start Date End Date Taking? Authorizing Provider  atenolol (TENORMIN) 100 MG tablet Take 1 tablet by mouth daily. 04/20/16  Yes [provider]  doxepin (SINEQUAN) 10 MG capsule Take 1 capsule by mouth 2 (two) times daily. 04/23/16  Yes [provider]  hydrochlorothiazide (HYDRODIURIL) 12.5 MG tablet Take 1 tablet by mouth daily. 05/18/16  Yes [provider]  losartan (COZAAR) 25 MG tablet Take 1 tablet by mouth daily. 05/18/16  Yes [provider]  sotalol (BETAPACE) 80 MG tablet Take 1 tablet by mouth 2 (two) times daily. 04/23/16  Yes [provider]  zaleplon (SONATA) 5 MG capsule Take 1 capsule by mouth daily. 06/18/16  Yes [provider]  warfarin (COUMADIN) 1 MG tablet Take 3 tablets by mouth daily.  04/20/16   [provider]  warfarin (COUMADIN) 4 MG tablet Take 4 mg by mouth one time only at 6 PM.  04/20/16   [provider]    Allergies Patient has no known allergies.  Family History  Problem Relation Age of Onset  . Heart failure Mother   . Asthma Father     Social History Social History  Substance Use Topics  . Smoking status: Never Smoker  . Smokeless tobacco: Never Used  . Alcohol use No  Review of Systems Constitutional: No fever/chills Eyes: No visual changes. ENT: No sore throat. Cardiovascular: Denies chest pain. Respiratory: +shortness of breath. Gastrointestinal: No abdominal pain.  No nausea, no vomiting.  No diarrhea.  No constipation. Genitourinary: Negative for dysuria. Musculoskeletal: +neck pain Integumentary: Negative for rash. Neurological: mild generalize headache. No focal numbness nor weakness   ____________________________________________   PHYSICAL EXAM:  VITAL SIGNS: ED Triage Vitals  Enc Vitals Group     BP 06/26/16 2130 (!)  160/85     Pulse Rate 06/26/16 2130 77     Resp 06/26/16 2130 (!) 21     Temp --      Temp src --      SpO2 06/26/16 2130 99 %     Weight --      Height --      Head Circumference --      Peak Flow --      Pain Score 06/26/16 2109 6     Pain Loc --      Pain Edu? --      Excl. in [place]? --     Constitutional: Alert and oriented. appears chronically ill and is in mild respiratory distress although improved on oxygen Eyes: Conjunctivae are normal.  Head: Atraumatic. Nose: No congestion/rhinnorhea. Mouth/Throat: Mucous membranes are moist. Neck: No stridor.  No meningeal signs.   Cardiovascular: Normal rate, irregular rhythm. Good peripheral circulation. Grossly normal heart sounds. Respiratory: Normal respiratory effort.  No retractions. coarse breath sounds throughout Gastrointestinal: Obese. Soft and nontender. No distention.  Musculoskeletal: No lower extremity tenderness nor edema. No gross deformities of extremities. no cervical spine tenderness to palpation. C-collar in place upon arrival Neurologic:  Normal speech and language. No gross focal neurologic deficits are appreciated.  Skin:  Skin is warm, dry and intact. No rash noted. Psychiatric: Mood and affect are normal. Speech and behavior are normal.  ____________________________________________   LABS (all labs ordered are listed, but only abnormal results are displayed)  Labs Reviewed  CBC WITH DIFFERENTIAL/PLATELET - Abnormal; Notable for the following:       Result Value   Neutro Abs 8.6 (*)    Lymphs Abs 0.5 (*)    All other components within normal limits  PROTIME-INR - Abnormal; Notable for the following:    Prothrombin Time 21.4 (*)    All other components within normal limits  COMPREHENSIVE METABOLIC PANEL - Abnormal; Notable for the following:    Sodium 134 (*)    Potassium 3.4 (*)    Chloride 97 (*)    Glucose, Bld 135 (*)    Calcium 8.6 (*)    Total Bilirubin 1.7 (*)    GFR calc non Af Amer 57 (*)      All other components within normal limits  BRAIN NATRIURETIC PEPTIDE - Abnormal; Notable for the following:    B Natriuretic Peptide 1,088.0 (*)    All other components within normal limits  CK - Abnormal; Notable for the following:    Total CK 35 (*)    All other components within normal limits  BASIC METABOLIC PANEL - Abnormal; Notable for the following:    Potassium 3.1 (*)    Chloride 96 (*)    Glucose, Bld 107 (*)    Calcium 8.8 (*)    GFR calc non Af Amer 51 (*)    GFR calc Af Amer 59 (*)    All other components within normal limits  MRSA PCR SCREENING  LIPASE, BLOOD  TROPONIN I  LACTIC ACID, PLASMA  CBC  MAGNESIUM  TSH   ____________________________________________  EKG  ED ECG REPORT I, Brinklee Cisse, the attending physician, personally viewed and interpreted this ECG.  Date: 06/27/2016 EKG Time: 21:10 Rate: 69 Rhythm: atrial fibrillation QRS Axis: normal Intervals: normal ST/T Wave abnormalities: Non-specific ST segment / T-wave changes, but no evidence of acute ischemia. Conduction Disturbances: none Narrative Interpretation: unremarkable  ____________________________________________  RADIOLOGY   Ct Head Wo Contrast  Result Date: 06/26/2016 CLINICAL DATA:  Fall, neck pain EXAM: CT HEAD WITHOUT CONTRAST CT CERVICAL SPINE WITHOUT CONTRAST TECHNIQUE: Multidetector CT imaging of the head and cervical spine was performed following the standard protocol without intravenous contrast. Multiplanar CT image reconstructions of the cervical spine were also generated. COMPARISON:  None. FINDINGS: CT HEAD FINDINGS Brain: No acute territorial infarction, hemorrhage or intracranial mass. Moderate-to-marked global atrophy. Prominent ventricles felt secondary to atrophy. Mild small vessel ischemic changes of the periventricular white matter. Old appearing lacunar infarct in the left basal ganglia. Vascular: No hyperdense vessels.  Carotid artery calcifications. Skull:  No  fracture or suspicious bone lesion. Sinuses/Orbits: No acute finding. Other: None CT CERVICAL SPINE FINDINGS Alignment: Reversal of cervical lordosis. Trace retrolisthesis of C4 on C5. Trace anterolisthesis of C3 on C4. Skull base and vertebrae: Craniovertebral junction is intact. Vertebral body heights are maintained. No fracture is seen. Soft tissues and spinal canal: No prevertebral fluid or swelling. No visible canal hematoma. Disc levels: Multilevel degenerative disc changes, moderate severe at C3-C4, C4-C5 and C5-C6. Large posterior disc osteophyte complex at C5-C6 and on the right side at C4-C5. Multilevel bilateral facet arthropathy. Severe bilateral foraminal stenosis at C5-C6 and on the right side at C4-C5. Upper chest: Negative. Other: Carotid artery calcifications. IMPRESSION: 1. No definite CT evidence for acute intracranial abnormality. 2. Reversal of cervical lordosis. No definite acute osseous abnormality. Electronically Signed   By: Donavan Foil M.D.   On: 06/26/2016 21:44   Ct Cervical Spine Wo Contrast  Result Date: 06/26/2016 CLINICAL DATA:  Fall, neck pain EXAM: CT HEAD WITHOUT CONTRAST CT CERVICAL SPINE WITHOUT CONTRAST TECHNIQUE: Multidetector CT imaging of the head and cervical spine was performed following the standard protocol without intravenous contrast. Multiplanar CT image reconstructions of the cervical spine were also generated. COMPARISON:  None. FINDINGS: CT HEAD FINDINGS Brain: No acute territorial infarction, hemorrhage or intracranial mass. Moderate-to-marked global atrophy. Prominent ventricles felt secondary to atrophy. Mild small vessel ischemic changes of the periventricular white matter. Old appearing lacunar infarct in the left basal ganglia. Vascular: No hyperdense vessels.  Carotid artery calcifications. Skull:  No fracture or suspicious bone lesion. Sinuses/Orbits: No acute finding. Other: None CT CERVICAL SPINE FINDINGS Alignment: Reversal of cervical lordosis.  Trace retrolisthesis of C4 on C5. Trace anterolisthesis of C3 on C4. Skull base and vertebrae: Craniovertebral junction is intact. Vertebral body heights are maintained. No fracture is seen. Soft tissues and spinal canal: No prevertebral fluid or swelling. No visible canal hematoma. Disc levels: Multilevel degenerative disc changes, moderate severe at C3-C4, C4-C5 and C5-C6. Large posterior disc osteophyte complex at C5-C6 and on the right side at C4-C5. Multilevel bilateral facet arthropathy. Severe bilateral foraminal stenosis at C5-C6 and on the right side at C4-C5. Upper chest: Negative. Other: Carotid artery calcifications. IMPRESSION: 1. No definite CT evidence for acute intracranial abnormality. 2. Reversal of cervical lordosis. No definite acute osseous abnormality. Electronically Signed   By: Donavan Foil M.D.   On: 06/26/2016 21:44   Dg Chest Portable 1 View  Result Date: 06/26/2016 CLINICAL DATA:  Fall EXAM: PORTABLE CHEST 1 VIEW COMPARISON:  06/08/2013 FINDINGS: Cardiomegaly with mild central vascular congestion. No focal consolidation or effusion. No pneumothorax. Old appearing right first and second rib deformity. Aortic atherosclerosis. Prominent mitral calcification. IMPRESSION: Moderate cardiomegaly with central vascular congestion. Electronically Signed   By: Donavan Foil M.D.   On: 06/26/2016 21:59    ____________________________________________   PROCEDURES  Critical Care performed: No   Procedure(s) performed:   Procedures   ____________________________________________   INITIAL IMPRESSION / ASSESSMENT AND PLAN / ED COURSE  Pertinent labs & imaging results that were available during my care of the patient were reviewed by me and considered in my medical decision making (see chart for details).  documentation was delayed due to multiple critical patients in the emergency department. In short, I obtained imaging of the patient's head and cervical spine which were both  reassuring. her chest x-ray indicates pulmonary vascular congestion and her BNP is significantly elevated at nearly 1100. This is consistent with acute congestive heart failure but without a history of the same. She is stable on 2L o2 and does not require BiPAP. I have given her Lasix 40 mg IV to promote diuresis and have admitted her to the hospital for further management. Of note her INR is subtherapeutic but there is no indication for further imaging at this time. I updated the patient and her C-spine was cleared clinically and radiographically.      ____________________________________________  FINAL CLINICAL IMPRESSION(S) / ED DIAGNOSES  Final diagnoses:  Acute congestive heart failure, unspecified heart failure type ([place])  Acute respiratory failure with hypoxemia ([facility])     MEDICATIONS GIVEN DURING THIS VISIT:  Medications  acetaminophen (TYLENOL) tablet 650 mg (not administered)    Or  acetaminophen (TYLENOL) suppository 650 mg (not administered)  ondansetron (ZOFRAN) tablet 4 mg (not administered)    Or  ondansetron (ZOFRAN) injection 4 mg (not administered)  doxepin (SINEQUAN) capsule 10 mg (10 mg Oral Given 06/27/16 0854)  losartan (COZAAR) tablet 25 mg (25 mg Oral Given 06/27/16 0854)  sotalol (BETAPACE) tablet 80 mg (80 mg Oral Given 06/27/16 0854)  MEDLINE mouth rinse (15 mLs Mouth Rinse Given 06/27/16 0855)  potassium chloride SA (K-DUR,KLOR-CON) CR tablet 40 mEq (40 mEq Oral Given 06/27/16 0853)  furosemide (LASIX) injection 20 mg (20 mg Intravenous Not Given 06/27/16 8127)  Warfarin - Pharmacist Dosing Inpatient (not administered)  metoprolol tartrate (LOPRESSOR) tablet 25 mg (not administered)  warfarin (COUMADIN) tablet 4 mg (not administered)  ipratropium-albuterol (DUONEB) 0.5-2.5 (3) MG/3ML nebulizer solution 3 mL (3 mLs Nebulization Given 06/26/16 2132)  ipratropium-albuterol (DUONEB) 0.5-2.5 (3) MG/3ML nebulizer solution 3 mL (3 mLs Nebulization Given 06/26/16 2133)    ipratropium-albuterol (DUONEB) 0.5-2.5 (3) MG/3ML nebulizer solution 3 mL (3 mLs Nebulization Given 06/26/16 2132)  furosemide (LASIX) injection 40 mg (40 mg Intravenous Given 06/27/16 0035)  furosemide (LASIX) injection 40 mg (40 mg Intravenous Given 06/27/16 0510)     NEW OUTPATIENT MEDICATIONS STARTED DURING THIS VISIT:  Current Discharge Medication List      Current Discharge Medication List      Current Discharge Medication List       Note:  This document was prepared using Dragon voice recognition software and may include unintentional dictation errors.    Hinda Kehr, MD 06/27/16 1149

## 2016-06-27 NOTE — Consult Note (Signed)
[facility] Consultation Note  Patient ID: Theresa Carrillo, MRN: [id number], DOB/AGE: 1929-09-08 83 y.o. Admit date: 06/26/2016   Date of Consult: 06/27/2016 Primary Physician: Glendon Axe, MD Primary Cardiologist:[place]  Chief Complaint:  Chief Complaint  Patient presents with  . Fall  . Respiratory Distress   Reason for Consult: acute on chronic congestive heart failure  HPI: 83 y.o. female with known paroxysmal nonvalvular atrial fibrillation currently with persistent atrial fibrillation despite sotalol use and metoprolol use with essential hypertension mixed hyperlipidemia having severe shortness of breath weakness and fatigue and wheezing with some hypoxia most consistent with acute on chronic diastolic dysfunction heart failure. Patient presented with shortness of breath and the chest x-ray consistent with pulmonary edema and currently has had some significant improvements with intravenous Lasix. The patient has had improvements of symptoms and now is able to lie flat. She does have good heart rate control with current medical regimen including metoprolol and sotalol sotalol. The patient does not have any bleeding complications but does continue to have warfarin for further risk reduction in stroke with atrial fibrillation. She has had a stroke in the past for which she has no residual issues. Currently there is no evidence of myocardial infarction but heart BNP is 1000  Past Medical History:  Diagnosis Date  . Anxiety   . Atrial fibrillation ([place])   . HLD (hyperlipidemia)   . Hypertension   . Stroke [facility])       Surgical History:  Past Surgical History:  Procedure Laterality Date  . ABDOMINAL HYSTERECTOMY    . HIP FRACTURE SURGERY    . REPLACEMENT TOTAL KNEE BILATERAL       Home Meds: Prior to Admission medications   Medication Sig Start Date End Date Taking? Authorizing Provider  atenolol (TENORMIN) 100 MG tablet Take 1 tablet by mouth daily. 04/20/16   Yes [provider]  doxepin (SINEQUAN) 10 MG capsule Take 1 capsule by mouth 2 (two) times daily. 04/23/16  Yes [provider]  doxepin (SINEQUAN) 10 MG capsule Take 20 mg by mouth at bedtime.   Yes [provider]  hydrochlorothiazide (HYDRODIURIL) 12.5 MG tablet Take 1 tablet by mouth daily. 05/18/16  Yes [provider]  losartan (COZAAR) 25 MG tablet Take 1 tablet by mouth daily. 05/18/16  Yes [provider]  sotalol (BETAPACE) 80 MG tablet Take 1 tablet by mouth 2 (two) times daily. 04/23/16  Yes [provider]  zaleplon (SONATA) 5 MG capsule Take 1 capsule by mouth daily. 06/18/16  Yes [provider]  warfarin (COUMADIN) 1 MG tablet Take 3 tablets by mouth daily.  04/20/16   [provider]  warfarin (COUMADIN) 4 MG tablet Take 4 mg by mouth one time only at 6 PM.  04/20/16   [provider]    Inpatient Medications:  . doxepin  10 mg Oral BID   And  . doxepin  20 mg Oral QHS  . furosemide  20 mg Intravenous BID  . losartan  25 mg Oral Daily  . mouth rinse  15 mL Mouth Rinse BID  . metoprolol tartrate  25 mg Oral BID  . potassium chloride  40 mEq Oral Daily  . [START ON 06/28/2016] sotalol  80 mg Oral Daily  . Warfarin - Pharmacist Dosing Inpatient   Does not apply q1800     Allergies: No Known Allergies  Social History   Social History  . Marital status: Widowed    Spouse name: N/A  .  Number of children: N/A  . Years of education: N/A   Occupational History  . Not on file.   Social History Main Topics  . Smoking status: Never Smoker  . Smokeless tobacco: Never Used  . Alcohol use No  . Drug use: No  . Sexual activity: Not on file   Other Topics Concern  . Not on file   Social History Narrative  . No narrative on file     Family History  Problem Relation Age of Onset  . Heart failure Mother   . Asthma Father      Review of Systems Positive forShortness breath weakness  fatigue Negative for: General:  chills, fever, night sweats or weight changes.  Cardiovascular: PND orthopnea syncope dizziness  Dermatological skin lesions rashes Respiratory: Cough congestion Urologic: Frequent urination urination at night and hematuria Abdominal: negative for nausea, vomiting, diarrhea, bright red blood per rectum, melena, or hematemesis Neurologic: negative for visual changes, and/or hearing changes  All other systems reviewed and are otherwise negative except as noted above.  Labs:  Recent Labs  06/26/16 2129  CKTOTAL 35*  TROPONINI <0.03   Lab Results  Component Value Date   WBC 8.6 06/27/2016   HGB 12.9 06/27/2016   HCT 36.9 06/27/2016   MCV 84.6 06/27/2016   PLT 199 06/27/2016    Recent Labs Lab 06/26/16 2129 06/27/16 0450  NA 134* 138  K 3.4* 3.1*  CL 97* 96*  CO2 26 31  BUN 19 18  CREATININE 0.89 0.98  CALCIUM 8.6* 8.8*  PROT 6.8  --   BILITOT 1.7*  --   ALKPHOS 56  --   ALT 14  --   AST 25  --   GLUCOSE 135* 107*   No results found for: CHOL, HDL, LDLCALC, TRIG No results found for: DDIMER  Radiology/Studies:  Ct Head Wo Contrast  Result Date: 06/26/2016 CLINICAL DATA:  Fall, neck pain EXAM: CT HEAD WITHOUT CONTRAST CT CERVICAL SPINE WITHOUT CONTRAST TECHNIQUE: Multidetector CT imaging of the head and cervical spine was performed following the standard protocol without intravenous contrast. Multiplanar CT image reconstructions of the cervical spine were also generated. COMPARISON:  None. FINDINGS: CT HEAD FINDINGS Brain: No acute territorial infarction, hemorrhage or intracranial mass. Moderate-to-marked global atrophy. Prominent ventricles felt secondary to atrophy. Mild small vessel ischemic changes of the periventricular white matter. Old appearing lacunar infarct in the left basal ganglia. Vascular: No hyperdense vessels.  Carotid artery calcifications. Skull:  No fracture or suspicious bone lesion. Sinuses/Orbits: No acute finding.  Other: None CT CERVICAL SPINE FINDINGS Alignment: Reversal of cervical lordosis. Trace retrolisthesis of C4 on C5. Trace anterolisthesis of C3 on C4. Skull base and vertebrae: Craniovertebral junction is intact. Vertebral body heights are maintained. No fracture is seen. Soft tissues and spinal canal: No prevertebral fluid or swelling. No visible canal hematoma. Disc levels: Multilevel degenerative disc changes, moderate severe at C3-C4, C4-C5 and C5-C6. Large posterior disc osteophyte complex at C5-C6 and on the right side at C4-C5. Multilevel bilateral facet arthropathy. Severe bilateral foraminal stenosis at C5-C6 and on the right side at C4-C5. Upper chest: Negative. Other: Carotid artery calcifications. IMPRESSION: 1. No definite CT evidence for acute intracranial abnormality. 2. Reversal of cervical lordosis. No definite acute osseous abnormality. Electronically Signed   By: Donavan Foil M.D.   On: 06/26/2016 21:44   Ct Cervical Spine Wo Contrast  Result Date: 06/26/2016 CLINICAL DATA:  Fall, neck pain EXAM: CT HEAD WITHOUT CONTRAST CT CERVICAL SPINE WITHOUT  CONTRAST TECHNIQUE: Multidetector CT imaging of the head and cervical spine was performed following the standard protocol without intravenous contrast. Multiplanar CT image reconstructions of the cervical spine were also generated. COMPARISON:  None. FINDINGS: CT HEAD FINDINGS Brain: No acute territorial infarction, hemorrhage or intracranial mass. Moderate-to-marked global atrophy. Prominent ventricles felt secondary to atrophy. Mild small vessel ischemic changes of the periventricular white matter. Old appearing lacunar infarct in the left basal ganglia. Vascular: No hyperdense vessels.  Carotid artery calcifications. Skull:  No fracture or suspicious bone lesion. Sinuses/Orbits: No acute finding. Other: None CT CERVICAL SPINE FINDINGS Alignment: Reversal of cervical lordosis. Trace retrolisthesis of C4 on C5. Trace anterolisthesis of C3 on C4.  Skull base and vertebrae: Craniovertebral junction is intact. Vertebral body heights are maintained. No fracture is seen. Soft tissues and spinal canal: No prevertebral fluid or swelling. No visible canal hematoma. Disc levels: Multilevel degenerative disc changes, moderate severe at C3-C4, C4-C5 and C5-C6. Large posterior disc osteophyte complex at C5-C6 and on the right side at C4-C5. Multilevel bilateral facet arthropathy. Severe bilateral foraminal stenosis at C5-C6 and on the right side at C4-C5. Upper chest: Negative. Other: Carotid artery calcifications. IMPRESSION: 1. No definite CT evidence for acute intracranial abnormality. 2. Reversal of cervical lordosis. No definite acute osseous abnormality. Electronically Signed   By: Donavan Foil M.D.   On: 06/26/2016 21:44   Dg Chest Portable 1 View  Result Date: 06/26/2016 CLINICAL DATA:  Fall EXAM: PORTABLE CHEST 1 VIEW COMPARISON:  06/08/2013 FINDINGS: Cardiomegaly with mild central vascular congestion. No focal consolidation or effusion. No pneumothorax. Old appearing right first and second rib deformity. Aortic atherosclerosis. Prominent mitral calcification. IMPRESSION: Moderate cardiomegaly with central vascular congestion. Electronically Signed   By: Donavan Foil M.D.   On: 06/26/2016 21:59    EKG: Atrial fibrillation with controlled ventricular rate and frequent preventricular contractions  Weights: Filed Weights   06/27/16 0157  Weight: 69.2 kg (152 lb 9.6 oz)     Physical Exam: Blood pressure (!) 120/92, pulse 73, temperature 98.9 F (37.2 C), temperature source Oral, resp. rate 18, height 5' (1.524 m), weight 69.2 kg (152 lb 9.6 oz), SpO2 96 %. Body mass index is 29.8 kg/m. General: Well developed, well nourished, in no acute distress. Head eyes ears nose throat: Normocephalic, atraumatic, sclera non-icteric, no xanthomas, nares are without discharge. No apparent thyromegaly and/or mass  Lungs: Normal respiratory effort.Diffuse  wheezes, basilar rales, no rhonchi.  Heart: RRR with normal S1 S2. no murmur gallop, no rub, PMI is normal size and placement, carotid upstroke normal without bruit, jugular venous pressure is normal Abdomen: Soft, non-tender, non-distended with normoactive bowel sounds. No hepatomegaly. No rebound/guarding. No obvious abdominal masses. Abdominal aorta is normal size without bruit Extremities: 1+ edema. no cyanosis, no clubbing, no ulcers  Peripheral : 2+ bilateral upper extremity pulses, 2+ bilateral femoral pulses, 2+ bilateral dorsal pedal pulse Neuro: Alert and oriented. No facial asymmetry. No focal deficit. Moves all extremities spontaneously. Musculoskeletal: Normal muscle tone without kyphosis Psych:  Responds to questions appropriately with a normal affect.    Assessment: 83 year old female with the paroxysmal nonvalvular atrial fibrillation currently with persistent atrial fibrillation despite sotalol and metoprolol use with good heart rate control on appropriate medication management having acute on chronic diastolic dysfunction heart failure  Plan: 1. Intravenous Lasix for further treatment of diastolic dysfunction congestive heart failure X 2. Continue sotalol and metoprolol for heart rate control of atrial fibrillation and further consideration of the conversion back to  normal sinus rhythm as necessary 3. Follow very closely for any concerns of chronic kidney disease which may cause significant issues with sotalol use 4. Anticoagulation for further risk reduction in stroke with atrial fibrillation without change 5. Further investigation the possibility of COPD exacerbation or bronchitis as a contributing to above  Signed, Corey Skains M.D. [facility] 06/27/2016, 5:20 PM

## 2016-06-27 NOTE — Plan of Care (Signed)
Problem: Activity: Goal: Risk for activity intolerance will decrease Outcome: Not Progressing Order for Physical therapy today  Problem: Fluid Volume: Goal: Ability to maintain a balanced intake and output will improve Outcome: Progressing Intake and output, daily weights  Problem: Cardiac: Goal: Ability to achieve and maintain adequate cardiopulmonary perfusion will improve Outcome: Progressing 2Decho today

## 2016-06-28 ENCOUNTER — Inpatient Hospital Stay: Payer: Medicare Other

## 2016-06-28 LAB — BASIC METABOLIC PANEL
Anion gap: 10 (ref 5–15)
BUN: 24 mg/dL — ABNORMAL HIGH (ref 6–20)
CALCIUM: 8.4 mg/dL — AB (ref 8.9–10.3)
CO2: 31 mmol/L (ref 22–32)
CREATININE: 1.09 mg/dL — AB (ref 0.44–1.00)
Chloride: 95 mmol/L — ABNORMAL LOW (ref 101–111)
GFR, EST AFRICAN AMERICAN: 52 mL/min — AB (ref >60)
GFR, EST NON AFRICAN AMERICAN: 45 mL/min — AB (ref >60)
Glucose, Bld: 105 mg/dL — ABNORMAL HIGH (ref 65–99)
Potassium: 3 mmol/L — ABNORMAL LOW (ref 3.5–5.1)
SODIUM: 136 mmol/L (ref 135–145)

## 2016-06-28 LAB — PROTIME-INR
INR: 2.24
PROTHROMBIN TIME: 25.2 s — AB (ref 11.4–15.2)

## 2016-06-28 MED ORDER — WARFARIN SODIUM 1 MG PO TABS: 3.0000 mg | ORAL_TABLET | ORAL | Status: DC

## 2016-06-28 MED ORDER — SODIUM CHLORIDE 0.9 % IV BOLUS (SEPSIS)
500.0000 mL | Freq: Once | INTRAVENOUS | Status: AC
  Administered 2016-06-28: 500 mL via INTRAVENOUS

## 2016-06-28 MED ORDER — WARFARIN SODIUM 1 MG PO TABS: 4.0000 mg | ORAL_TABLET | ORAL | Status: DC

## 2016-06-28 MED ORDER — METOPROLOL TARTRATE 25 MG PO TABS: 25.0000 mg | ORAL_TABLET | Freq: Two times a day (BID) | ORAL | 0 refills | Status: DC

## 2016-06-28 MED ORDER — LEVOFLOXACIN 750 MG PO TABS
750.0000 mg | ORAL_TABLET | ORAL | Status: DC
  Administered 2016-06-28: 750 mg via ORAL
  Filled 2016-06-28: qty 1

## 2016-06-28 MED ORDER — SODIUM CHLORIDE 0.9 % IV BOLUS (SEPSIS)
250.0000 mL | Freq: Once | INTRAVENOUS | Status: AC
  Administered 2016-06-28: 250 mL via INTRAVENOUS

## 2016-06-28 MED ORDER — SODIUM CHLORIDE 0.9 % IV BOLUS (SEPSIS): 500.0000 mL | Freq: Once | INTRAVENOUS | Status: DC

## 2016-06-28 MED ORDER — POLYETHYLENE GLYCOL 3350 17 G PO PACK
17.0000 g | PACK | Freq: Every day | ORAL | Status: DC
  Filled 2016-06-28: qty 1

## 2016-06-28 MED ORDER — SOTALOL HCL 80 MG PO TABS: 80.0000 mg | ORAL_TABLET | Freq: Every day | ORAL | 0 refills | Status: AC

## 2016-06-28 MED ORDER — LEVOFLOXACIN 750 MG PO TABS: 750.0000 mg | ORAL_TABLET | ORAL | 0 refills | Status: AC

## 2016-06-28 NOTE — Progress Notes (Signed)
[facility] Encounter Note  Patient: Theresa Carrillo / Admit Date: 06/26/2016 / Date of Encounter: 06/28/2016, 8:51 AM   Subjective: Patient has had significant improvements of shortness of breath pulmonary edema cough and congestion with intravenous Lasix. Patient has had chest x-ray consistent with possible pneumonia requiring further treatment. Heart rate of atrial fibrillation is controlled with sotalol and metoprolol but remains in atrial fibrillation at this time. No evidence of myocardial infarction  Review of Systems: Positive for: Atrial fibrillation with controlled ventricular rate and shortness of breath Negative for: Vision change, hearing change, syncope, dizziness, nausea, vomiting,diarrhea, bloody stool, stomach pain, cough, congestion, diaphoresis, urinary frequency, urinary pain,skin lesions, skin rashes Others previously listed  Objective: Telemetry: Atrial fibrillation Physical Exam: Blood pressure 124/76, pulse 61, temperature 98.3 F (36.8 C), temperature source Oral, resp. rate 18, height 5' (1.524 m), weight 66.3 kg (146 lb 1.6 oz), SpO2 94 %. Body mass index is 28.53 kg/m. General: Well developed, well nourished, in no acute distress. Head: Normocephalic, atraumatic, sclera non-icteric, no xanthomas, nares are without discharge. Neck: No apparent masses Lungs: Normal respirations with diffuse wheezes, no rhonchi, no rales , nasal or crackles   Heart: Irregular rate and rhythm, normal S1 S2, no murmur, no rub, no gallop, PMI is normal size and placement, carotid upstroke normal without bruit, jugular venous pressure normal Abdomen: Soft, non-tender, non-distended with normoactive bowel sounds. No hepatosplenomegaly. Abdominal aorta is normal size without bruit Extremities: No edema, no clubbing, no cyanosis, no ulcers,  Peripheral: 2+ radial, 2+ femoral, 2+ dorsal pedal pulses Neuro: Alert and oriented. Moves all extremities  spontaneously. Psych:  Responds to questions appropriately with a normal affect.   Intake/Output Summary (Last 24 hours) at 06/28/16 0851 Last data filed at 06/28/16 0430  Gross per 24 hour  Intake              240 ml  Output              350 ml  Net             -110 ml    Inpatient Medications:  . doxepin  10 mg Oral BID   And  . doxepin  20 mg Oral QHS  . furosemide  20 mg Intravenous BID  . levofloxacin  750 mg Oral Q48H  . losartan  25 mg Oral Daily  . mouth rinse  15 mL Mouth Rinse BID  . metoprolol tartrate  25 mg Oral BID  . potassium chloride  40 mEq Oral Daily  . sotalol  80 mg Oral Daily  . warfarin  4 mg Oral Once per day on Mon Wed Fri   And  . [START ON 06/29/2016] warfarin  3 mg Oral Once per day on Sun Tue Thu Sat  . Warfarin - Pharmacist Dosing Inpatient   Does not apply q1800   Infusions:   Labs:  Recent Labs  06/27/16 0450 06/28/16 0430  NA 138 136  K 3.1* 3.0*  CL 96* 95*  CO2 31 31  GLUCOSE 107* 105*  BUN 18 24*  CREATININE 0.98 1.09*  CALCIUM 8.8* 8.4*  MG 1.8  --     Recent Labs  06/26/16 2129  AST 25  ALT 14  ALKPHOS 56  BILITOT 1.7*  PROT 6.8  ALBUMIN 4.1    Recent Labs  06/26/16 2129 06/27/16 0450  WBC 9.9 8.6  NEUTROABS 8.6*  --   HGB 12.3 12.9  HCT 35.3 36.9  MCV 84.5  84.6  PLT 193 199    Recent Labs  06/26/16 2129  CKTOTAL 35*  TROPONINI <0.03   Invalid input(s): POCBNP No results for input(s): HGBA1C in the last 72 hours.   Weights: Filed Weights   06/27/16 0157 06/28/16 0500  Weight: 69.2 kg (152 lb 9.6 oz) 66.3 kg (146 lb 1.6 oz)     Radiology/Studies:  Dg Chest 1 View  Result Date: 06/28/2016 CLINICAL DATA:  Pulmonary edema. EXAM: CHEST 1 VIEW COMPARISON:  06/26/2016 . FINDINGS: Mediastinum and hilar structures normal. Cardiomegaly. Mitral annular calcification. Normal pulmonary vascularity . Mild infiltrate left mid lung. Low lung volumes. No pleural effusion or pneumothorax. Degenerative changes  and scoliosis thoracic spine. IMPRESSION: 1. Cardiomegaly.  No pulmonary venous congestion. 2.  Low lung volumes.  Mild infiltrate left mid lung. Electronically Signed   By: Marcello Moores  Register   On: 06/28/2016 07:02   Ct Head Wo Contrast  Result Date: 06/26/2016 CLINICAL DATA:  Fall, neck pain EXAM: CT HEAD WITHOUT CONTRAST CT CERVICAL SPINE WITHOUT CONTRAST TECHNIQUE: Multidetector CT imaging of the head and cervical spine was performed following the standard protocol without intravenous contrast. Multiplanar CT image reconstructions of the cervical spine were also generated. COMPARISON:  None. FINDINGS: CT HEAD FINDINGS Brain: No acute territorial infarction, hemorrhage or intracranial mass. Moderate-to-marked global atrophy. Prominent ventricles felt secondary to atrophy. Mild small vessel ischemic changes of the periventricular white matter. Old appearing lacunar infarct in the left basal ganglia. Vascular: No hyperdense vessels.  Carotid artery calcifications. Skull:  No fracture or suspicious bone lesion. Sinuses/Orbits: No acute finding. Other: None CT CERVICAL SPINE FINDINGS Alignment: Reversal of cervical lordosis. Trace retrolisthesis of C4 on C5. Trace anterolisthesis of C3 on C4. Skull base and vertebrae: Craniovertebral junction is intact. Vertebral body heights are maintained. No fracture is seen. Soft tissues and spinal canal: No prevertebral fluid or swelling. No visible canal hematoma. Disc levels: Multilevel degenerative disc changes, moderate severe at C3-C4, C4-C5 and C5-C6. Large posterior disc osteophyte complex at C5-C6 and on the right side at C4-C5. Multilevel bilateral facet arthropathy. Severe bilateral foraminal stenosis at C5-C6 and on the right side at C4-C5. Upper chest: Negative. Other: Carotid artery calcifications. IMPRESSION: 1. No definite CT evidence for acute intracranial abnormality. 2. Reversal of cervical lordosis. No definite acute osseous abnormality. Electronically  Signed   By: Donavan Foil M.D.   On: 06/26/2016 21:44   Ct Cervical Spine Wo Contrast  Result Date: 06/26/2016 CLINICAL DATA:  Fall, neck pain EXAM: CT HEAD WITHOUT CONTRAST CT CERVICAL SPINE WITHOUT CONTRAST TECHNIQUE: Multidetector CT imaging of the head and cervical spine was performed following the standard protocol without intravenous contrast. Multiplanar CT image reconstructions of the cervical spine were also generated. COMPARISON:  None. FINDINGS: CT HEAD FINDINGS Brain: No acute territorial infarction, hemorrhage or intracranial mass. Moderate-to-marked global atrophy. Prominent ventricles felt secondary to atrophy. Mild small vessel ischemic changes of the periventricular white matter. Old appearing lacunar infarct in the left basal ganglia. Vascular: No hyperdense vessels.  Carotid artery calcifications. Skull:  No fracture or suspicious bone lesion. Sinuses/Orbits: No acute finding. Other: None CT CERVICAL SPINE FINDINGS Alignment: Reversal of cervical lordosis. Trace retrolisthesis of C4 on C5. Trace anterolisthesis of C3 on C4. Skull base and vertebrae: Craniovertebral junction is intact. Vertebral body heights are maintained. No fracture is seen. Soft tissues and spinal canal: No prevertebral fluid or swelling. No visible canal hematoma. Disc levels: Multilevel degenerative disc changes, moderate severe at C3-C4, C4-C5 and C5-C6.  Large posterior disc osteophyte complex at C5-C6 and on the right side at C4-C5. Multilevel bilateral facet arthropathy. Severe bilateral foraminal stenosis at C5-C6 and on the right side at C4-C5. Upper chest: Negative. Other: Carotid artery calcifications. IMPRESSION: 1. No definite CT evidence for acute intracranial abnormality. 2. Reversal of cervical lordosis. No definite acute osseous abnormality. Electronically Signed   By: Donavan Foil M.D.   On: 06/26/2016 21:44   Dg Chest Portable 1 View  Result Date: 06/26/2016 CLINICAL DATA:  Fall EXAM: PORTABLE CHEST 1  VIEW COMPARISON:  06/08/2013 FINDINGS: Cardiomegaly with mild central vascular congestion. No focal consolidation or effusion. No pneumothorax. Old appearing right first and second rib deformity. Aortic atherosclerosis. Prominent mitral calcification. IMPRESSION: Moderate cardiomegaly with central vascular congestion. Electronically Signed   By: Donavan Foil M.D.   On: 06/26/2016 21:59     Assessment and Recommendation  83 y.o. female with the known paroxysmal nonvalvular atrial fibrillation currently in atrial fibrillation with controlled ventricular rate on appropriate medication management with acute on chronic diastolic dysfunction congestive heart failure secondary to hypoxia and pneumonia without evidence of myocardial infarction X 1. Continue metoprolol sotalol for heart rate control of atrial fibrillation and further adjustment and treatment options as outpatient for possible conversion to normal sinus rhythm 2. Continue warfarin for further risk reduction in stroke with atrial fibrillation with adjustment of dose due to antibiotic use 3. Losartan for hypertension control without change today 4. Furosemide orally for lower extremity edema pulmonary edema and diastolic dysfunction heart failure 5. No further cardiac diagnostics necessary at this time 6. Ambulate and follow for improvements and possible discharged home if ambulating well with follow-up in next 2 weeks for adjustments  Signed, Serafina Royals M.D. FACC

## 2016-06-28 NOTE — Care Management Important Message (Signed)
Important Message  Patient Details  Name: Theresa Carrillo MRN: [id number] Date of Birth: Mar 29, 1929   Medicare Important Message Given:  Yes  Signed IM notice given     Katrina Stack, RN 06/28/2016, 11:42 AM

## 2016-06-28 NOTE — Progress Notes (Signed)
Pharmacy Antibiotic Note  Theresa Carrillo is a 83 y.o. female admitted on 06/26/2016 with acute on chronic CHF. Now being treating for  pneumonia/CAP.  Pharmacy has been consulted for levofloxacin dosing. Patient is taking other PO medications, WBC has been WNL and and patient is afebrile.    Plan: Will start patient levofloxacin 750mg  PO every 48 hours based on current CrCl of 26ml/min  Height: 5' (152.4 cm) Weight: 146 lb 1.6 oz (66.3 kg) IBW/kg (Calculated) : 45.5  Temp (24hrs), Avg:98.9 F (37.2 C), Min:98.3 F (36.8 C), Max:99.2 F (37.3 C)   Recent Labs Lab 06/26/16 2129 06/27/16 0450 06/28/16 0430  WBC 9.9 8.6  --   CREATININE 0.89 0.98 1.09*  LATICACIDVEN 1.0  --   --     Estimated Creatinine Clearance: 32 mL/min (A) (by C-G formula based on SCr of 1.09 mg/dL (H)).    No Known Allergies  Antimicrobials this admission: 5/18 levofloxacin >>   Dose adjustments this admission:  Microbiology results: 5/18 MRSA PCR: negative   Thank you for allowing pharmacy to be a part of this patient's care.  Pernell Dupre, PharmD, BCPS Clinical Pharmacist 06/28/2016 7:41 AM

## 2016-06-28 NOTE — Progress Notes (Signed)
ANTICOAGULATION CONSULT NOTE - Initial Consult  Pharmacy Consult for Warfarin Dosing Indication: atrial fibrillation  No Known Allergies  Patient Measurements: Height: 5' (152.4 cm) Weight: 146 lb 1.6 oz (66.3 kg) IBW/kg (Calculated) : 45.5 Heparin Dosing Weight:   Vital Signs: Temp: 98.3 F (36.8 C) (05/18 0446) Temp Source: Oral (05/18 0446) BP: 124/76 (05/18 0446) Pulse Rate: 61 (05/18 0446)  Labs:  Recent Labs  06/26/16 2129 06/27/16 0450 06/28/16 0430  HGB 12.3 12.9  --   HCT 35.3 36.9  --   PLT 193 199  --   LABPROT 21.4*  --  25.2*  INR 1.83  --  2.24  CREATININE 0.89 0.98 1.09*  CKTOTAL 35*  --   --   TROPONINI <0.03  --   --     Estimated Creatinine Clearance: 32 mL/min (A) (by C-G formula based on SCr of 1.09 mg/dL (H)).   Medical History: Past Medical History:  Diagnosis Date  . Anxiety   . Atrial fibrillation ([place])   . HLD (hyperlipidemia)   . Hypertension   . Stroke [facility])     Medications:  Scheduled:  . doxepin  10 mg Oral BID   And  . doxepin  20 mg Oral QHS  . furosemide  20 mg Intravenous BID  . levofloxacin  750 mg Oral Q48H  . losartan  25 mg Oral Daily  . mouth rinse  15 mL Mouth Rinse BID  . metoprolol tartrate  25 mg Oral BID  . potassium chloride  40 mEq Oral Daily  . sotalol  80 mg Oral Daily  . Warfarin - Pharmacist Dosing Inpatient   Does not apply q1800   Infusions:    Assessment: 83 yo F on Warfarin at home for Afib admitted with fall, SOB, acute CHF. Home (assisted living at [facility]) dose Warfarin: Warfarin 3 mg Tue, Thur, Sat, Sun Warfarin 4 mg Mon, Wed, Fri  5/16 INR (at 2129)= 1.83   NO Warfarin charted 5/17 No INR  DATE  INR  DOSE 5/17  1.83   4mg  5/18  2.24  4mg       Goal of Therapy:  INR 2-3 Monitor platelets by anticoagulation protocol: Yes   Plan:  Will continue patients home regimen of warfarin 3mg  Tue, Thur, Sat, Sun, and warfarin 4mg  Mon, Wed, fri. Will continue to monitor INR daily to  make sure INR is stable.   Pernell Dupre, PharmD, BCPS Clinical Pharmacist 06/28/2016 7:44 AM

## 2016-06-28 NOTE — Evaluation (Signed)
Physical Therapy Evaluation Patient Details Name: Theresa Carrillo MRN: [id number] DOB: 1929/04/08 Today's Date: 06/28/2016   History of Present Illness  presented to ER secondary to SOB, respiratory distress; admitted with acute CHF.  Clinical Impression  Upon evaluation, patient alert and oriented; follows all commands.  Motivated to participate/progress with therapy as tolerated.  Demonstrates ability to complete bed mobility with mod indep; sit/stand, basic transfers and gait (75') with RW, cga/close sup.  Short, shuffling steps, but no overt buckling or LOB. Does demonstrate significant drop in systolic BP with transition to upright (see vitals flowsheet), mildly symptomatic; however, appears to somewhat accommodate to upright position with activity.  RN informed/aware. Would benefit from skilled PT to address above deficits and promote optimal return to PLOF; Recommend transition to [place] upon discharge from acute hospitalization when medically appropriate.     Follow Up Recommendations Home health PT    Equipment Recommendations  Rolling walker with 5" wheels (has 4WRW in home environment)    Recommendations for Other Services       Precautions / Restrictions Precautions Precautions: Fall Restrictions Weight Bearing Restrictions: No      Mobility  Bed Mobility Overal bed mobility: Modified Independent                Transfers Overall transfer level: Needs assistance Equipment used: Rolling walker (2 wheeled) Transfers: Sit to/from Stand Sit to Stand: Min guard            Ambulation/Gait Ambulation/Gait assistance: Min guard Ambulation Distance (Feet): 75 Feet Assistive device: Rolling walker (2 wheeled)       General Gait Details: very short, shuffling steps with decreased cadence and overall gait speed.  no overt buckling or LOB; fair awareness of higher level balance deficits.  Mild reports of lightheadedness with transition to upright  Stairs            Wheelchair Mobility    Modified Rankin (Stroke Patients Only)       Balance Overall balance assessment: Needs assistance Sitting-balance support: No upper extremity supported;Feet supported Sitting balance-Leahy Scale: Good     Standing balance support: Bilateral upper extremity supported Standing balance-Leahy Scale: Fair                               Pertinent Vitals/Pain Pain Assessment: No/denies pain    Home Living Family/patient expects to be discharged to:: Assisted living Living Arrangements: Alone             Home Equipment: Walker - 4 wheels      Prior Function Level of Independence: Independent with assistive device(s)         Comments: Mod indep with ADLs, household mobility with 4WRW; ambulates to/from dining hall for 2 meals/day.  Denies fall history.     Hand Dominance        Extremity/Trunk Assessment   Upper Extremity Assessment Upper Extremity Assessment: Generalized weakness    Lower Extremity Assessment Lower Extremity Assessment: Generalized weakness (grossly 3+ to 4-/5 throughout)       Communication   Communication: No difficulties  Cognition Arousal/Alertness: Awake/alert Behavior During Therapy: WFL for tasks assessed/performed Overall Cognitive Status: Within Functional Limits for tasks assessed                                        General Comments  Exercises Other Exercises Other Exercises: Standing LE therex, 1x10, AROM with RW: heel raises, mini squats. Other Exercises: Orthostatic assessment-see above   Assessment/Plan    PT Assessment Patient needs continued PT services  PT Problem List Decreased strength;Decreased activity tolerance;Decreased balance;Decreased mobility;Cardiopulmonary status limiting activity       PT Treatment Interventions DME instruction;Gait training;Functional mobility training;Therapeutic activities;Therapeutic exercise;Balance  training;Patient/family education    PT Goals (Current goals can be found in the Care Plan section)  Acute Rehab PT Goals Patient Stated Goal: to get stronger and return home PT Goal Formulation: With patient Time For Goal Achievement: 07/12/16 Potential to Achieve Goals: Good    Frequency Min 2X/week   Barriers to discharge        Co-evaluation               AM-PAC PT "6 Clicks" Daily Activity  Outcome Measure Difficulty turning over in bed (including adjusting bedclothes, sheets and blankets)?: None Difficulty moving from lying on back to sitting on the side of the bed? : None Difficulty sitting down on and standing up from a chair with arms (e.g., wheelchair, bedside commode, etc,.)?: A Little Help needed moving to and from a bed to chair (including a wheelchair)?: A Little Help needed walking in hospital room?: A Little Help needed climbing 3-5 steps with a railing? : A Little 6 Click Score: 20    End of Session Equipment Utilized During Treatment: Gait belt Activity Tolerance: Patient tolerated treatment well Patient left: with call bell/phone within reach;in bed;with bed alarm set Nurse Communication: Mobility status (othostatics) PT Visit Diagnosis: Muscle weakness (generalized) (M62.81);Difficulty in walking, not elsewhere classified (R26.2)    Time: 9191-6606 PT Time Calculation (min) (ACUTE ONLY): 26 min   Charges:   PT Evaluation $PT Eval Low Complexity: 1 Procedure PT Treatments $Therapeutic Activity: 8-22 mins   PT G Codes:        Ranie Chinchilla H. Owens Shark, PT, DPT, NCS 06/28/16, 3:03 PM [phone]

## 2016-06-28 NOTE — Discharge Instructions (Signed)
Metoprolol extended-release tablets What is this medicine? METOPROLOL (me TOE proe lole) is a beta-blocker. Beta-blockers reduce the workload on the heart and help it to beat more regularly. This medicine is used to treat high blood pressure and to prevent chest pain. It is also used to after a heart attack and to prevent an additional heart attack from occurring. This medicine may be used for other purposes; ask your health care provider or pharmacist if you have questions. COMMON BRAND NAME(S): toprol, Toprol XL What should I tell my health care provider before I take this medicine? They need to know if you have any of these conditions: -diabetes -heart or vessel disease like slow heart rate, worsening heart failure, heart block, sick sinus syndrome or Raynaud's disease -kidney disease -liver disease -lung or breathing disease, like asthma or emphysema -pheochromocytoma -thyroid disease -an unusual or allergic reaction to metoprolol, other beta-blockers, medicines, foods, dyes, or preservatives -pregnant or trying to get pregnant -breast-feeding How should I use this medicine? Take this medicine by mouth with a glass of water. Follow the directions on the prescription label. Do not crush or chew. Take this medicine with or immediately after meals. Take your doses at regular intervals. Do not take more medicine than directed. Do not stop taking this medicine suddenly. This could lead to serious heart-related effects. Talk to your pediatrician regarding the use of this medicine in children. While this drug may be prescribed for children as young as 6 years for selected conditions, precautions do apply. Overdosage: If you think you have taken too much of this medicine contact a poison control center or emergency room at once. NOTE: This medicine is only for you. Do not share this medicine with others. What if I miss a dose? If you miss a dose, take it as soon as you can. If it is almost time  for your next dose, take only that dose. Do not take double or extra doses. What may interact with this medicine? This medicine may interact with the following medications: -certain medicines for blood pressure, heart disease, irregular heart beat -certain medicines for depression, like monoamine oxidase (MAO) inhibitors, fluoxetine, or paroxetine -clonidine -dobutamine -epinephrine -isoproterenol -reserpine This list may not describe all possible interactions. Give your health care provider a list of all the medicines, herbs, non-prescription drugs, or dietary supplements you use. Also tell them if you smoke, drink alcohol, or use illegal drugs. Some items may interact with your medicine. What should I watch for while using this medicine? Visit your doctor or health care professional for regular check ups. Contact your doctor right away if your symptoms worsen. Check your blood pressure and pulse rate regularly. Ask your health care professional what your blood pressure and pulse rate should be, and when you should contact them. You may get drowsy or dizzy. Do not drive, use machinery, or do anything that needs mental alertness until you know how this medicine affects you. Do not sit or stand up quickly, especially if you are an older patient. This reduces the risk of dizzy or fainting spells. Contact your doctor if these symptoms continue. Alcohol may interfere with the effect of this medicine. Avoid alcoholic drinks. What side effects may I notice from receiving this medicine? Side effects that you should report to your doctor or health care professional as soon as possible: -allergic reactions like skin rash, itching or hives -cold or numb hands or feet -depression -difficulty breathing -faint -fever with sore throat -irregular heartbeat, chest pain -  rapid weight gain -swollen legs or ankles Side effects that usually do not require medical attention (report to your doctor or health care  professional if they continue or are bothersome): -anxiety or nervousness -change in sex drive or performance -dry skin -headache -nightmares or trouble sleeping -short term memory loss -stomach upset or diarrhea -unusually tired This list may not describe all possible side effects. Call your doctor for medical advice about side effects. You may report side effects to FDA at 1-800-FDA-1088. Where should I keep my medicine? Keep out of the reach of children. Store at room temperature between 15 and 30 degrees C (59 and 86 degrees F). Throw away any unused medicine after the expiration date. NOTE: This sheet is a summary. It may not cover all possible information. If you have questions about this medicine, talk to your doctor, pharmacist, or health care provider.  2018 Elsevier/Gold Standard (2012-10-02 14:41:37) Levofloxacin tablets What is this medicine? LEVOFLOXACIN (lee voe FLOX a sin) is a quinolone antibiotic. It is used to treat certain kinds of bacterial infections. It will not work for colds, flu, or other viral infections. This medicine may be used for other purposes; ask your health care provider or pharmacist if you have questions. COMMON BRAND NAME(S): Levaquin, Levaquin Leva-Pak What should I tell my health care provider before I take this medicine? They need to know if you have any of these conditions: -bone problems -diabetes -history of low levels of potassium in the blood -irregular heartbeat -joint problems -kidney disease -liver disease -myasthenia gravis -seizures -tendon problems -tingling of the fingers or toes, or other nerve disorder -an unusual or allergic reaction to levofloxacin, other quinolone antibiotics, foods, dyes, or preservatives -pregnant or trying to get pregnant -breast-feeding How should I use this medicine? Take this medicine by mouth with a full glass of water. Follow the directions on the prescription label. This medicine can be taken  with or without food. Take your medicine at regular intervals. Do not take your medicine more often than directed. Do not skip doses or stop your medicine early even if you feel better. Do not stop taking except on your doctor's advice. A special MedGuide will be given to you by the pharmacist with each prescription and refill. Be sure to read this information carefully each time. Talk to your pediatrician regarding the use of this medicine in children. While this drug may be prescribed for children as young as 6 months for selected conditions, precautions do apply. Overdosage: If you think you have taken too much of this medicine contact a poison control center or emergency room at once. NOTE: This medicine is only for you. Do not share this medicine with others. What if I miss a dose? If you miss a dose, take it as soon as you remember. If it is almost time for your next dose, take only that dose. Do not take double or extra doses. What may interact with this medicine? Do not take this medicine with any of the following medications: -bepridil -certain medicines for depression, anxiety, or psychotic disturbances like pimozide, thioridazine, and ziprasidone -certain medicines for irregular heart beat like dofetilide and dronedarone -cisapride -halofantrine This medicine may also interact with the following medications: -antacids -birth control pills -certain medicines for diabetes, like glipizide, glyburide, or insulin -didanosine buffered tablets or powder -multivitamins -NSAIDS, medicines for pain and inflammation, like ibuprofen or naproxen -steroid medicines like prednisone or cortisone -sucralfate -theophylline -warfarin This list may not describe all possible interactions. Give your  health care provider a list of all the medicines, herbs, non-prescription drugs, or dietary supplements you use. Also tell them if you smoke, drink alcohol, or use illegal drugs. Some items may interact  with your medicine. What should I watch for while using this medicine? Tell your doctor or healthcare professional if your symptoms do not start to get better or if they get worse. Do not treat diarrhea with over the counter products. Contact your doctor if you have diarrhea that lasts more than 2 days or if it is severe and watery. Check with your doctor or health care professional if you get an attack of severe diarrhea, nausea and vomiting, or if you sweat a lot. The loss of too much body fluid can make it dangerous for you to take this medicine. You may get drowsy or dizzy. Do not drive, use machinery, or do anything that needs mental alertness until you know how this medicine affects you. Do not sit or stand up quickly, especially if you are an older patient. This reduces the risk of dizzy or fainting spells. This medicine can make you more sensitive to the sun. Keep out of the sun. If you cannot avoid being in the sun, wear protective clothing and use a sunscreen. Do not use sun lamps or tanning beds/booths. Contact your doctor if you get a sunburn. If you are a diabetic monitor your blood glucose carefully. If you get an unusual reading stop taking this medicine and call your doctor right away. Avoid antacids, calcium, iron, and zinc products for 2 hours before and 2 hours after taking a dose of this medicine. What side effects may I notice from receiving this medicine? Side effects that you should report to your doctor or health care professional as soon as possible: -allergic reactions like skin rash or hives, swelling of the face, lips, or tongue -anxious -breathing problems -confusion -depressed mood -diarrhea -dizziness -fast, irregular heartbeat -hallucination, loss of contact with reality -joint, muscle, or tendon pain or swelling -muscle weakness -pain, tingling, numbness in the hands or feet -seizures -signs and symptoms of high blood sugar such as dizziness; dry mouth; dry  skin; fruity breath; nausea; stomach pain; increased hunger or thirst; increased urination -signs and symptoms of liver injury like dark yellow or brown urine; general ill feeling or flu-like symptoms; light-colored stools; loss of appetite; nausea; right upper belly pain; unusually weak or tired; yellowing of the eyes or skin -signs and symptoms of low blood sugar such as feeling anxious; confusion; dizziness; increased hunger; unusually weak or tired; sweating; shakiness; cold; irritable; headache; blurred vision; fast heartbeat; loss of consciousness -suicidal thoughts or other mood changes -sunburn -unusually weak or tired Side effects that usually do not require medical attention (report to your doctor or health care professional if they continue or are bothersome): -constipation -dry mouth -headache -nausea, vomiting -trouble sleeping This list may not describe all possible side effects. Call your doctor for medical advice about side effects. You may report side effects to FDA at 1-800-FDA-1088. Where should I keep my medicine? Keep out of the reach of children. Store at room temperature between 15 and 30 degrees C (59 and 86 degrees F). Keep in a tightly closed container. Throw away any unused medicine after the expiration date. NOTE: This sheet is a summary. It may not cover all possible information. If you have questions about this medicine, talk to your doctor, pharmacist, or health care provider.  2018 Elsevier/Gold Standard (2015-08-08 12:38:27)   Heart  Failure Heart failure means your heart has trouble pumping blood. This makes it hard for your body to work well. Heart failure is usually a long-term (chronic) condition. You must take good care of yourself and follow your doctor's treatment plan. Follow these instructions at home:  Take your heart medicine as told by your doctor.  Do not stop taking medicine unless your doctor tells you to.  Do not skip any dose of  medicine.  Refill your medicines before they run out.  Take other medicines only as told by your doctor or pharmacist.  Stay active if told by your doctor. The elderly and people with severe heart failure should talk with a doctor about physical activity.  Eat heart-healthy foods. Choose foods that are without trans fat and are low in saturated fat, cholesterol, and salt (sodium). This includes fresh or frozen fruits and vegetables, fish, lean meats, fat-free or low-fat dairy foods, whole grains, and high-fiber foods. Lentils and dried peas and beans (legumes) are also good choices.  Limit salt if told by your doctor.  Cook in a healthy way. Roast, grill, broil, bake, poach, steam, or stir-fry foods.  Limit fluids as told by your doctor.  Weigh yourself every morning. Do this after you pee (urinate) and before you eat breakfast. Write down your weight to give to your doctor.  Take your blood pressure and write it down if your doctor tells you to.  Ask your doctor how to check your pulse. Check your pulse as told.  Lose weight if told by your doctor.  Stop smoking or chewing tobacco. Do not use gum or patches that help you quit without your doctor's approval.  Schedule and go to doctor visits as told.  Nonpregnant women should have no more than 1 drink a day. Men should have no more than 2 drinks a day. Talk to your doctor about drinking alcohol.  Stop illegal drug use.  Stay current with shots (immunizations).  Manage your health conditions as told by your doctor.  Learn to manage your stress.  Rest when you are tired.  If it is really hot outside:  Avoid intense activities.  Use air conditioning or fans, or get in a cooler place.  Avoid caffeine and alcohol.  Wear loose-fitting, lightweight, and light-colored clothing.  If it is really cold outside:  Avoid intense activities.  Layer your clothing.  Wear mittens or gloves, a hat, and a scarf when going  outside.  Avoid alcohol.  Learn about heart failure and get support as needed.  Get help to maintain or improve your quality of life and your ability to care for yourself as needed. Contact a doctor if:  You gain weight quickly.  You are more short of breath than usual.  You cannot do your normal activities.  You tire easily.  You cough more than normal, especially with activity.  You have any or more puffiness (swelling) in areas such as your hands, feet, ankles, or belly (abdomen).  You cannot sleep because it is hard to breathe.  You feel like your heart is beating fast (palpitations).  You get dizzy or light-headed when you stand up. Get help right away if:  You have trouble breathing.  There is a change in mental status, such as becoming less alert or not being able to focus.  You have chest pain or discomfort.  You faint. This information is not intended to replace advice given to you by your health care provider. Make sure you  discuss any questions you have with your health care provider. Document Released: 11/07/2007 Document Revised: 07/06/2015 Document Reviewed: 03/16/2012 Elsevier Interactive Patient Education  2017 [facility] appointment on Jul 09, 2016 at 11:20am with Theresa Carrillo, [place]. Please call [phone] to reschedule.

## 2016-06-28 NOTE — Progress Notes (Signed)
Patient discharged via wheelchair and private vehicle. IV removed and catheter intact. All discharge instructions given and patient verbalizes understanding. Tele removed and returned. No prescriptions given to patient No distress noted.   

## 2016-06-28 NOTE — Discharge Summary (Signed)
[facility] NAME: Theresa Carrillo    MR#:  [id number]  DATE OF BIRTH:  [birth year]  DATE OF ADMISSION:  06/26/2016 ADMITTING PHYSICIAN: Lance Coon, MD  DATE OF DISCHARGE: 06/28/2016  PRIMARY CARE PHYSICIAN: Glendon Axe, MD    ADMISSION DIAGNOSIS:  Acute respiratory failure with hypoxemia ([place]) [J96.01] Acute congestive heart failure, unspecified heart failure type ([place]) [I50.9]  DISCHARGE DIAGNOSIS:  Acute hypoxic respiratory failure Acute diastolic heart failure SECONDARY DIAGNOSIS:   Past Medical History:  Diagnosis Date  . Anxiety   . Atrial fibrillation ([place])   . HLD (hyperlipidemia)   . Hypertension   . Stroke [facility])     HOSPITAL COURSE:    83 year old female with history of atrial fibrillation, hyperlipidemia and CVA who presents with shortness of breath.  1. Acute hypoxic respiratory failure in the setting of Acute Diastolic CHF exacerbation:  Patient has been weaned off of oxygen. CHF exacerbation is resolved. She will be referred to CHF clinic at discharge. Echocardiogram showed ejection fraction 65-70%. I suspect that atrial fibrillation may be contributing to CHF.  2. Chronic atrial fibrillation: Due to concerns of decreased GFR sotalol has been decreased from twice a day to once a day. She will continue on the Toprol by mouth she will follow-up with her cardiologist within 1 week.   3. Anxiety and depression: Continue doxepin  4. Essential hypertension: Patient is now started on metoprolol in place of atenolol. She will follow-up with cardiology.  5. Community acquired pneumonia: After patient's pulmonary edema had resolved a repeat chest x-ray was performed and it does show left lower lobe infiltrate. She is therefore started on Levaquin which is renally dosed as per pharmacy consult.  DISCHARGE CONDITIONS AND DIET:   Patient is stable to be discharged on heart healthy diet  CONSULTS OBTAINED:     DRUG ALLERGIES:  No Known Allergies  DISCHARGE MEDICATIONS:   Current Discharge Medication List    START taking these medications   Details  levofloxacin (LEVAQUIN) 750 MG tablet Take 1 tablet (750 mg total) by mouth every other day. Qty: 2 tablet, Refills: 0    metoprolol tartrate (LOPRESSOR) 25 MG tablet Take 1 tablet (25 mg total) by mouth 2 (two) times daily. Qty: 60 tablet, Refills: 0      CONTINUE these medications which have CHANGED   Details  sotalol (BETAPACE) 80 MG tablet Take 1 tablet (80 mg total) by mouth daily. Qty: 30 tablet, Refills: 0      CONTINUE these medications which have NOT CHANGED   Details  !! doxepin (SINEQUAN) 10 MG capsule Take 1 capsule by mouth 2 (two) times daily.    !! doxepin (SINEQUAN) 10 MG capsule Take 20 mg by mouth at bedtime.    losartan (COZAAR) 25 MG tablet Take 1 tablet by mouth daily.    zaleplon (SONATA) 5 MG capsule Take 1 capsule by mouth daily.    !! warfarin (COUMADIN) 1 MG tablet Take 3 tablets by mouth daily.     !! warfarin (COUMADIN) 4 MG tablet Take 4 mg by mouth one time only at 6 PM.      !! - Potential duplicate medications found. Please discuss with provider.    STOP taking these medications     atenolol (TENORMIN) 100 MG tablet      hydrochlorothiazide (HYDRODIURIL) 12.5 MG tablet           Today   CHIEF COMPLAINT:  Doing well today  wants to go home   VITAL SIGNS:  Blood pressure 124/76, pulse 61, temperature 98.3 F (36.8 C), temperature source Oral, resp. rate 18, height 5' (1.524 m), weight 66.3 kg (146 lb 1.6 oz), SpO2 94 %.   REVIEW OF SYSTEMS:  Review of Systems  Constitutional: Negative.  Negative for chills, fever and malaise/fatigue.  HENT: Negative.  Negative for ear discharge, ear pain, hearing loss, nosebleeds and sore throat.   Eyes: Negative.  Negative for blurred vision and pain.  Respiratory: Positive for cough. Negative for hemoptysis, shortness of breath and wheezing.    Cardiovascular: Negative.  Negative for chest pain, palpitations and leg swelling.  Gastrointestinal: Negative.  Negative for abdominal pain, blood in stool, diarrhea, nausea and vomiting.  Genitourinary: Negative.  Negative for dysuria.  Musculoskeletal: Negative.  Negative for back pain.  Skin: Negative.   Neurological: Negative for dizziness, tremors, speech change, focal weakness, seizures and headaches.  Endo/Heme/Allergies: Negative.  Does not bruise/bleed easily.  Psychiatric/Behavioral: Negative.  Negative for depression, hallucinations and suicidal ideas.     PHYSICAL EXAMINATION:  GENERAL:  83 y.o.-year-old patient lying in the bed with no acute distress.  NECK:  Supple, no jugular venous distention. No thyroid enlargement, no tenderness.  LUNGS: Normal breath sounds bilaterally, no wheezing, rales,rhonchi  No use of accessory muscles of respiration.  CARDIOVASCULAR: irr, irr No murmurs, rubs, or gallops.  ABDOMEN: Soft, non-tender, non-distended. Bowel sounds present. No organomegaly or mass.  EXTREMITIES: No pedal edema, cyanosis, or clubbing.  PSYCHIATRIC: The patient is alert and oriented x 3.  SKIN: No obvious rash, lesion, or ulcer.   DATA REVIEW:   CBC  Recent Labs Lab 06/27/16 0450  WBC 8.6  HGB 12.9  HCT 36.9  PLT 199    Chemistries   Recent Labs Lab 06/26/16 2129 06/27/16 0450 06/28/16 0430  NA 134* 138 136  K 3.4* 3.1* 3.0*  CL 97* 96* 95*  CO2 26 31 31   GLUCOSE 135* 107* 105*  BUN 19 18 24*  CREATININE 0.89 0.98 1.09*  CALCIUM 8.6* 8.8* 8.4*  MG  --  1.8  --   AST 25  --   --   ALT 14  --   --   ALKPHOS 56  --   --   BILITOT 1.7*  --   --     Cardiac Enzymes  Recent Labs Lab 06/26/16 2129  TROPONINI <0.03    Microbiology Results  @[email or web address] @  RADIOLOGY:  Dg Chest 1 View  Result Date: 06/28/2016 CLINICAL DATA:  Pulmonary edema. EXAM: CHEST 1 VIEW COMPARISON:  06/26/2016 . FINDINGS: Mediastinum and hilar structures normal.  Cardiomegaly. Mitral annular calcification. Normal pulmonary vascularity . Mild infiltrate left mid lung. Low lung volumes. No pleural effusion or pneumothorax. Degenerative changes and scoliosis thoracic spine. IMPRESSION: 1. Cardiomegaly.  No pulmonary venous congestion. 2.  Low lung volumes.  Mild infiltrate left mid lung. Electronically Signed   By: Marcello Moores  Register   On: 06/28/2016 07:02   Ct Head Wo Contrast  Result Date: 06/26/2016 CLINICAL DATA:  Fall, neck pain EXAM: CT HEAD WITHOUT CONTRAST CT CERVICAL SPINE WITHOUT CONTRAST TECHNIQUE: Multidetector CT imaging of the head and cervical spine was performed following the standard protocol without intravenous contrast. Multiplanar CT image reconstructions of the cervical spine were also generated. COMPARISON:  None. FINDINGS: CT HEAD FINDINGS Brain: No acute territorial infarction, hemorrhage or intracranial mass. Moderate-to-marked global atrophy. Prominent ventricles felt secondary to atrophy. Mild small vessel ischemic changes of the periventricular  white matter. Old appearing lacunar infarct in the left basal ganglia. Vascular: No hyperdense vessels.  Carotid artery calcifications. Skull:  No fracture or suspicious bone lesion. Sinuses/Orbits: No acute finding. Other: None CT CERVICAL SPINE FINDINGS Alignment: Reversal of cervical lordosis. Trace retrolisthesis of C4 on C5. Trace anterolisthesis of C3 on C4. Skull base and vertebrae: Craniovertebral junction is intact. Vertebral body heights are maintained. No fracture is seen. Soft tissues and spinal canal: No prevertebral fluid or swelling. No visible canal hematoma. Disc levels: Multilevel degenerative disc changes, moderate severe at C3-C4, C4-C5 and C5-C6. Large posterior disc osteophyte complex at C5-C6 and on the right side at C4-C5. Multilevel bilateral facet arthropathy. Severe bilateral foraminal stenosis at C5-C6 and on the right side at C4-C5. Upper chest: Negative. Other: Carotid artery  calcifications. IMPRESSION: 1. No definite CT evidence for acute intracranial abnormality. 2. Reversal of cervical lordosis. No definite acute osseous abnormality. Electronically Signed   By: Donavan Foil M.D.   On: 06/26/2016 21:44   Ct Cervical Spine Wo Contrast  Result Date: 06/26/2016 CLINICAL DATA:  Fall, neck pain EXAM: CT HEAD WITHOUT CONTRAST CT CERVICAL SPINE WITHOUT CONTRAST TECHNIQUE: Multidetector CT imaging of the head and cervical spine was performed following the standard protocol without intravenous contrast. Multiplanar CT image reconstructions of the cervical spine were also generated. COMPARISON:  None. FINDINGS: CT HEAD FINDINGS Brain: No acute territorial infarction, hemorrhage or intracranial mass. Moderate-to-marked global atrophy. Prominent ventricles felt secondary to atrophy. Mild small vessel ischemic changes of the periventricular white matter. Old appearing lacunar infarct in the left basal ganglia. Vascular: No hyperdense vessels.  Carotid artery calcifications. Skull:  No fracture or suspicious bone lesion. Sinuses/Orbits: No acute finding. Other: None CT CERVICAL SPINE FINDINGS Alignment: Reversal of cervical lordosis. Trace retrolisthesis of C4 on C5. Trace anterolisthesis of C3 on C4. Skull base and vertebrae: Craniovertebral junction is intact. Vertebral body heights are maintained. No fracture is seen. Soft tissues and spinal canal: No prevertebral fluid or swelling. No visible canal hematoma. Disc levels: Multilevel degenerative disc changes, moderate severe at C3-C4, C4-C5 and C5-C6. Large posterior disc osteophyte complex at C5-C6 and on the right side at C4-C5. Multilevel bilateral facet arthropathy. Severe bilateral foraminal stenosis at C5-C6 and on the right side at C4-C5. Upper chest: Negative. Other: Carotid artery calcifications. IMPRESSION: 1. No definite CT evidence for acute intracranial abnormality. 2. Reversal of cervical lordosis. No definite acute osseous  abnormality. Electronically Signed   By: Donavan Foil M.D.   On: 06/26/2016 21:44   Dg Chest Portable 1 View  Result Date: 06/26/2016 CLINICAL DATA:  Fall EXAM: PORTABLE CHEST 1 VIEW COMPARISON:  06/08/2013 FINDINGS: Cardiomegaly with mild central vascular congestion. No focal consolidation or effusion. No pneumothorax. Old appearing right first and second rib deformity. Aortic atherosclerosis. Prominent mitral calcification. IMPRESSION: Moderate cardiomegaly with central vascular congestion. Electronically Signed   By: Donavan Foil M.D.   On: 06/26/2016 21:59      Current Discharge Medication List    START taking these medications   Details  levofloxacin (LEVAQUIN) 750 MG tablet Take 1 tablet (750 mg total) by mouth every other day. Qty: 2 tablet, Refills: 0    metoprolol tartrate (LOPRESSOR) 25 MG tablet Take 1 tablet (25 mg total) by mouth 2 (two) times daily. Qty: 60 tablet, Refills: 0      CONTINUE these medications which have CHANGED   Details  sotalol (BETAPACE) 80 MG tablet Take 1 tablet (80 mg total) by mouth daily. Qty:  30 tablet, Refills: 0      CONTINUE these medications which have NOT CHANGED   Details  !! doxepin (SINEQUAN) 10 MG capsule Take 1 capsule by mouth 2 (two) times daily.    !! doxepin (SINEQUAN) 10 MG capsule Take 20 mg by mouth at bedtime.    losartan (COZAAR) 25 MG tablet Take 1 tablet by mouth daily.    zaleplon (SONATA) 5 MG capsule Take 1 capsule by mouth daily.    !! warfarin (COUMADIN) 1 MG tablet Take 3 tablets by mouth daily.     !! warfarin (COUMADIN) 4 MG tablet Take 4 mg by mouth one time only at 6 PM.      !! - Potential duplicate medications found. Please discuss with provider.    STOP taking these medications     atenolol (TENORMIN) 100 MG tablet      hydrochlorothiazide (HYDRODIURIL) 12.5 MG tablet            Management plans discussed with the patient and she is in agreement. Stable for discharge home  Patient should  follow up with pcp  CODE STATUS:     Code Status Orders        Start     Ordered   06/27/16 0210  Do not attempt resuscitation (DNR)  Continuous    Question Answer Comment  In the event of cardiac or respiratory ARREST Do not call a "code blue"   In the event of cardiac or respiratory ARREST Do not perform Intubation, CPR, defibrillation or ACLS   In the event of cardiac or respiratory ARREST Use medication by any route, position, wound care, and other measures to relive pain and suffering. May use oxygen, suction and manual treatment of airway obstruction as needed for comfort.      06/27/16 0209    Code Status History    Date Active Date Inactive Code Status Order ID Comments User Context   06/27/2016 12:25 AM 06/27/2016  2:09 AM DNR [id number]  Hinda Kehr, MD ED    Advance Directive Documentation     Most Recent Value  Type of Advance Directive  Out of facility DNR (pink MOST or yellow form)  Pre-existing out of facility DNR order (yellow form or pink MOST form)  Yellow form placed in chart (order not valid for inpatient use)  "MOST" Form in Place?  -      TOTAL TIME TAKING CARE OF THIS PATIENT: 38 minutes.    Note: This dictation was prepared with Dragon dictation along with smaller phrase technology. Any transcriptional errors that result from this process are unintentional.  Samar Dass M.D on 06/28/2016 at 8:35 AM  Between 7am to 6pm - Pager - [phone] After 6pm go to [email or web address] - password [email or web address] [facility]  Office  [phone]  CC: Primary care physician; Glendon Axe, MD

## 2016-07-04 ENCOUNTER — Other Ambulatory Visit: Payer: Self-pay | Admitting: Nephrology

## 2016-07-04 DIAGNOSIS — R1310 Dysphagia, unspecified: Secondary | ICD-10-CM

## 2016-07-09 ENCOUNTER — Ambulatory Visit: Payer: Medicare Other | Admitting: Family

## 2016-07-12 ENCOUNTER — Ambulatory Visit: Payer: Medicare Other | Attending: Family | Admitting: Family

## 2016-07-12 ENCOUNTER — Encounter: Payer: Self-pay | Admitting: Family

## 2016-07-12 VITALS — BP 109/56 | HR 72 | Resp 20 | Ht 60.0 in | Wt 151.2 lb

## 2016-07-12 DIAGNOSIS — Z825 Family history of asthma and other chronic lower respiratory diseases: Secondary | ICD-10-CM | POA: Diagnosis not present

## 2016-07-12 DIAGNOSIS — Z7901 Long term (current) use of anticoagulants: Secondary | ICD-10-CM | POA: Diagnosis not present

## 2016-07-12 DIAGNOSIS — Z8673 Personal history of transient ischemic attack (TIA), and cerebral infarction without residual deficits: Secondary | ICD-10-CM | POA: Diagnosis not present

## 2016-07-12 DIAGNOSIS — Z87891 Personal history of nicotine dependence: Secondary | ICD-10-CM | POA: Diagnosis not present

## 2016-07-12 DIAGNOSIS — I10 Essential (primary) hypertension: Secondary | ICD-10-CM

## 2016-07-12 DIAGNOSIS — I4891 Unspecified atrial fibrillation: Secondary | ICD-10-CM | POA: Insufficient documentation

## 2016-07-12 DIAGNOSIS — E785 Hyperlipidemia, unspecified: Secondary | ICD-10-CM | POA: Diagnosis not present

## 2016-07-12 DIAGNOSIS — I5032 Chronic diastolic (congestive) heart failure: Secondary | ICD-10-CM | POA: Insufficient documentation

## 2016-07-12 DIAGNOSIS — Z96653 Presence of artificial knee joint, bilateral: Secondary | ICD-10-CM | POA: Diagnosis not present

## 2016-07-12 DIAGNOSIS — F419 Anxiety disorder, unspecified: Secondary | ICD-10-CM | POA: Diagnosis not present

## 2016-07-12 DIAGNOSIS — Z8249 Family history of ischemic heart disease and other diseases of the circulatory system: Secondary | ICD-10-CM | POA: Diagnosis not present

## 2016-07-12 DIAGNOSIS — I110 Hypertensive heart disease with heart failure: Secondary | ICD-10-CM | POA: Insufficient documentation

## 2016-07-12 DIAGNOSIS — Z90710 Acquired absence of both cervix and uterus: Secondary | ICD-10-CM | POA: Insufficient documentation

## 2016-07-12 DIAGNOSIS — Z7902 Long term (current) use of antithrombotics/antiplatelets: Secondary | ICD-10-CM | POA: Insufficient documentation

## 2016-07-12 DIAGNOSIS — I480 Paroxysmal atrial fibrillation: Secondary | ICD-10-CM

## 2016-07-12 NOTE — Patient Instructions (Signed)
Continue weighing daily and call for an overnight weight gain of > 2 pounds or a weekly weight gain of >5 pounds. 

## 2016-07-12 NOTE — Progress Notes (Signed)
Patient ID: Theresa Carrillo, female    DOB: 1929-07-04, 83 y.o.   MRN: [id number]  HPI  Theresa Carrillo is a 83 y/o female with a history of pneumonia, anxiety, atrial fibrillation, hyperlipidemia, HTN, CVA and chronic heart failure.   Reviewed last echo report that was done 06/27/16 which showed an EF of 65-70% along with mild MR.   Admitted 06/26/16 with HF exacerbation. Initially needed oxygen which was weaned off. Cardiology consult obtained and she was discharged home after 2 days.   She presents today for her initial visit with a chief complaint of mild fatigue with moderate exertion. She describes this as chronic in nature and has been present for years in varying severity. She has associated palpitations and lightheadedness along with this.   Past Medical History:  Diagnosis Date  . Anxiety   . Atrial fibrillation ([place])   . CHF (congestive heart failure) ([place])   . HLD (hyperlipidemia)   . Hypertension   . Stroke [facility])    Past Surgical History:  Procedure Laterality Date  . ABDOMINAL HYSTERECTOMY    . HIP FRACTURE SURGERY    . REPLACEMENT TOTAL KNEE BILATERAL     Family History  Problem Relation Age of Onset  . Heart failure Mother   . Asthma Father    Social History  Substance Use Topics  . Smoking status: Former Smoker    Packs/day: 0.25    Years: 20.00    Types: Cigarettes  . Smokeless tobacco: Never Used     Comment: social  . Alcohol use No   No Known Allergies Prior to Admission medications   Medication Sig Start Date End Date Taking? Authorizing Provider  doxepin (SINEQUAN) 10 MG capsule Take 10 mg by mouth 3 (three) times daily.    Yes [provider]  losartan (COZAAR) 25 MG tablet Take 1 tablet by mouth daily. 05/18/16  Yes [provider]  metoprolol tartrate (LOPRESSOR) 25 MG tablet Take 1 tablet (25 mg total) by mouth 2 (two) times daily. 06/28/16  Yes Mody, Ulice Bold, MD  sotalol (BETAPACE) 80 MG tablet Take 1 tablet (80 mg total) by mouth  daily. 06/28/16  Yes Mody, Ulice Bold, MD  warfarin (COUMADIN) 1 MG tablet Take 3 tablets by mouth daily.  04/20/16  Yes [provider]  warfarin (COUMADIN) 4 MG tablet Take 4 mg by mouth one time only at 6 PM.  04/20/16  Yes [provider]   Review of Systems  Constitutional: Positive for appetite change (decreased appetite) and fatigue.  HENT: Positive for rhinorrhea (chronic). Negative for congestion and sore throat.   Eyes: Negative.   Respiratory: Negative for cough, chest tightness and shortness of breath.   Cardiovascular: Positive for palpitations (at times). Negative for chest pain and leg swelling.  Gastrointestinal: Negative for abdominal distention and abdominal pain.  Endocrine: Negative.   Genitourinary: Negative.   Musculoskeletal: Positive for back pain (chronic low back pain). Negative for neck pain.  Skin: Negative.   Allergic/Immunologic: Negative.   Neurological: Positive for light-headedness (with standing). Negative for dizziness.  Hematological: Negative for adenopathy. Bruises/bleeds easily.  Psychiatric/Behavioral: Positive for sleep disturbance (chronic difficulty sleeping). Negative for dysphoric mood and suicidal ideas. The patient is not nervous/anxious.    Vitals:   07/12/16 1101  BP: (!) 109/56  Pulse: 72  Resp: 20  SpO2: 99%  Weight: 151 lb 4 oz (68.6 kg)  Height: 5' (1.524 m)   Wt Readings from Last 3 Encounters:  07/12/16 151 lb  4 oz (68.6 kg)  06/28/16 146 lb 1.6 oz (66.3 kg)   Lab Results  Component Value Date   CREATININE 1.09 (H) 06/28/2016   CREATININE 0.98 06/27/2016   CREATININE 0.89 06/26/2016   Physical Exam  Constitutional: She is oriented to person, place, and time. She appears well-developed and well-nourished.  HENT:  Head: Normocephalic and atraumatic.  Neck: Normal range of motion. Neck supple. No JVD present.  Cardiovascular: Normal rate.  An irregular rhythm present.  Pulmonary/Chest: Effort normal. She has no  wheezes. She has no rales.  Abdominal: Soft. She exhibits no distension. There is no tenderness.  Musculoskeletal: She exhibits no edema or tenderness.  Neurological: She is alert and oriented to person, place, and time.  Skin: Skin is warm and dry.  Psychiatric: She has a normal mood and affect. Her behavior is normal. Thought content normal.  Nursing note and vitals reviewed.  Assessment & Plan:  1: Chronic heart failure with preserved ejection fraction- - NYHA class II - euvolemic - instructed to call for an overnight weight gain of >2 pounds or a weekly weight gain of >5 pounds - not adding salt to her food and tries to pick foods at [facility] that are lower in sodium and that don't have any sauces/gravies on it. Discussed the importance of closely following a 2000mg  sodium diet and written dietary information was given to her about this - going on 2 long walks daily along with doing leg/arm exercises daily - to resume wearing her TED hose daily with removal at bedtime  2: Atrial fibrillation- - currently rate controlled on lopressor and sotalol - tolerating warfarin with some bruising noted - saw cardiologist Clayborn Bigness) 07/04/16  3: HTN- - BP on the low side, may need to decrease losartan if it remains low - saw PCP Candiss Norse) 07/01/16  Medication bottles were reviewed.  Return in 1 month or sooner for any questions/problems before then.

## 2016-07-14 DIAGNOSIS — I5032 Chronic diastolic (congestive) heart failure: Secondary | ICD-10-CM | POA: Insufficient documentation

## 2016-07-25 ENCOUNTER — Ambulatory Visit
Admission: RE | Admit: 2016-07-25 | Discharge: 2016-07-25 | Disposition: A | Discharge status: Home / Self Care | Payer: Medicare Other | Source: Ambulatory Visit | Attending: Nephrology | Admitting: Nephrology

## 2016-07-25 DIAGNOSIS — R1310 Dysphagia, unspecified: Secondary | ICD-10-CM | POA: Diagnosis present

## 2016-07-25 DIAGNOSIS — R1312 Dysphagia, oropharyngeal phase: Secondary | ICD-10-CM

## 2016-07-25 NOTE — Therapy (Signed)
[place], [place], [address] Phone: [phone]   Fax:     Modified Barium Swallow  Patient Details  Name: Theresa Carrillo MRN: [id number] Date of Birth: 05-30-1929 No Data Recorded  Encounter Date: 07/25/2016      End of Session - 07/25/16 1357    Visit Number 1   Number of Visits 1   Date for SLP Re-Evaluation 07/25/16   SLP Start Time 56   SLP Stop Time  1310   SLP Time Calculation (min) 50 min   Activity Tolerance Patient tolerated treatment well      Past Medical History:  Diagnosis Date  . Anxiety   . Atrial fibrillation ([place])   . CHF (congestive heart failure) ([place])   . HLD (hyperlipidemia)   . Hypertension   . Stroke [facility])     Past Surgical History:  Procedure Laterality Date  . ABDOMINAL HYSTERECTOMY    . HIP FRACTURE SURGERY    . REPLACEMENT TOTAL KNEE BILATERAL      There were no vitals filed for this visit.    Subjective: Patient behavior: (alertness, ability to follow instructions, etc.): The patient is able to follow directions and verbalize her swallowing symptoms  Chief complaint: foods get stuck in her throat, when she attempts liquid wash, it "tickles" or "comes back up" and she coughs.  Patient also reports vocal quality change- primarily in the AM and the sensation of mucus in her throat    Objective:  Radiological Procedure: A videoflouroscopic evaluation of oral-preparatory, reflex initiation, and pharyngeal phases of the swallow was performed; as well as a screening of the upper esophageal phase.  I. POSTURE: Upright in MBS chair  II. VIEW: Lateral  III. COMPENSATORY STRATEGIES: N/A  IV. BOLUSES ADMINISTERED:   Thin Liquid: 2 small sips, 3 rapid consecutive cup rim sips, 3 rapid consecutive straw sips   Nectar-thick Liquid: 1 moderate bolus   Honey-thick Liquid: DNT   Puree: 2 teaspoon presentations   Mechanical Soft: 1/4 graham cracker in  applesauce   V. RESULTS OF EVALUATION: A. ORAL PREPARATORY PHASE: (The lips, tongue, and velum are observed for strength and coordination)       **Overall Severity Rating: Within functional limits; xerostomia causes drier foods (graham cracker) to stick to teeth requiring more effort to dislodge.  B. SWALLOW INITIATION/REFLEX: (The reflex is normal if "triggered" by the time the bolus reached the base of the tongue)  **Overall Severity Rating: Mild; pharyngeal response begins as bolus reaches valleculae with full response while falling from the valleculae to the pyriform sinuses  C. PHARYNGEAL PHASE: (Pharyngeal function is normal if the bolus shows rapid, smooth, and continuous transit through the pharynx and there is no pharyngeal residue after the swallow)  **Overall Severity Rating: Within normal limits  D. LARYNGEAL PENETRATION: (Material entering into the laryngeal inlet/vestibule but not aspirated) Trace, transient, shallow penetration with rapid consecutive drinking- NO laryngeal vestibule residue  E. ASPIRATION: None  F. ESOPHAGEAL PHASE: (Screening of the upper esophagus) no observed abnormality within the viewable cervical esophagus and no observed esophageal-to-pharynx backflow  ASSESSMENT: This 83 year old woman; S/P CVA (found on MRI 2008), recent left lobe pneumonia (May 2018), and swallowing complaints (foods seem to get stuck in her throat, when she attempts liquid wash, it "tickles" or "comes back up" and she coughs); is presenting with mild oropharyngeal dysphagia characterized primarily by delayed pharyngeal swallow initiation.  Oral control of the bolus including  oral hold, rotary mastication, and anterior to posterior transfer are within functional limits, although impacted by xerostomia.  Aspects of the pharyngeal stage of swallowing including tongue base retraction, hyolaryngeal excursion, epiglottic inversion, and duration/amplitude of UES opening are within normal limits.   There is no significant pharyngeal residue.  There was shallow / transient laryngeal penetration with rapid straw sips of thin liquid and no tracheal aspiration.  The patient does not appear to be at risk for prandial aspiration.  No esophageal-to-pharynx backflow was observed; however, the patient's description of symptoms indicates potential for regurgitation or esophageal-to-pharynx backflow.  The patient had a delayed cough response (at least 4 minutes after the last liquid bolus) and stated that "that last sip just came back up".  The patient may benefit from referral to GI to assess esophageal function.  Recommend ENT consult secondary patient complaint of vocal quality change and the sensation of mucus "in my throat all the time!"  PLAN/RECOMMENDATIONS:    A. Diet: Regular with thin liquids, moisten and soften as needed for ease of swallowing   B. Swallowing Precautions: smaller more frequent meals, stay upright for 90 minutes after meals, stringent oral care, reflux precautions    C. Recommended consultation to: GI and ENT   D. Therapy recommendations: speech therapy is not indicated at this time   E. Results and recommendations were discussed with the patient and her daughter immediately following the study and the final report routed to the referring MD.     Oropharyngeal dysphagia - Plan: DG OP Swallowing Func-Medicare/Speech Path, DG OP Swallowing Func-Medicare/Speech Path      G-Codes - 2016/08/05 1358    Functional Assessment Tool Used MBS,clinical judgment   Functional Limitations Swallowing   Swallow Current Status (K2409) At least 20 percent but less than 40 percent impaired, limited or restricted   Swallow Goal Status (B3532) At least 20 percent but less than 40 percent impaired, limited or restricted   Swallow Discharge Status [phone]) At least 20 percent but less than 40 percent impaired, limited or restricted          Problem List Patient Active Problem List    Diagnosis Date Noted  . Chronic diastolic heart failure ([place]) 07/14/2016  . Acute systolic CHF (congestive heart failure) ([place]) 06/27/2016  . AF (paroxysmal atrial fibrillation) ([place]) 06/27/2016  . Anxiety 06/27/2016  . HTN (hypertension) 06/27/2016  . HLD (hyperlipidemia) 06/27/2016   Leroy Sea, MS/CCC- SLP  Lou Miner 08-05-16, 1:59 PM  [facility] [place], [place], [address] Phone: [phone]   Fax:     Name: Theresa Carrillo MRN: [id number] Date of Birth: 01-18-1930

## 2016-08-12 ENCOUNTER — Ambulatory Visit: Payer: Medicare Other | Admitting: Family

## 2017-03-10 ENCOUNTER — Encounter: Payer: Self-pay | Admitting: Medical Oncology

## 2017-03-10 ENCOUNTER — Emergency Department: Payer: Medicare Other

## 2017-03-10 ENCOUNTER — Emergency Department
Admission: EM | Admit: 2017-03-10 | Discharge: 2017-03-10 | Disposition: A | Discharge status: Home / Self Care | Payer: Medicare Other | Attending: Emergency Medicine | Admitting: Emergency Medicine

## 2017-03-10 DIAGNOSIS — I110 Hypertensive heart disease with heart failure: Secondary | ICD-10-CM | POA: Insufficient documentation

## 2017-03-10 DIAGNOSIS — Z79899 Other long term (current) drug therapy: Secondary | ICD-10-CM | POA: Diagnosis not present

## 2017-03-10 DIAGNOSIS — Z87891 Personal history of nicotine dependence: Secondary | ICD-10-CM | POA: Diagnosis not present

## 2017-03-10 DIAGNOSIS — R531 Weakness: Secondary | ICD-10-CM

## 2017-03-10 DIAGNOSIS — Z7901 Long term (current) use of anticoagulants: Secondary | ICD-10-CM | POA: Diagnosis not present

## 2017-03-10 DIAGNOSIS — Z96653 Presence of artificial knee joint, bilateral: Secondary | ICD-10-CM | POA: Insufficient documentation

## 2017-03-10 DIAGNOSIS — M6281 Muscle weakness (generalized): Secondary | ICD-10-CM | POA: Insufficient documentation

## 2017-03-10 DIAGNOSIS — I5032 Chronic diastolic (congestive) heart failure: Secondary | ICD-10-CM | POA: Insufficient documentation

## 2017-03-10 DIAGNOSIS — F419 Anxiety disorder, unspecified: Secondary | ICD-10-CM | POA: Diagnosis not present

## 2017-03-10 DIAGNOSIS — Z8673 Personal history of transient ischemic attack (TIA), and cerebral infarction without residual deficits: Secondary | ICD-10-CM | POA: Diagnosis not present

## 2017-03-10 DIAGNOSIS — J40 Bronchitis, not specified as acute or chronic: Secondary | ICD-10-CM | POA: Diagnosis not present

## 2017-03-10 LAB — URINALYSIS, COMPLETE (UACMP) WITH MICROSCOPIC
BILIRUBIN URINE: NEGATIVE
Glucose, UA: NEGATIVE mg/dL
Hgb urine dipstick: NEGATIVE
Ketones, ur: NEGATIVE mg/dL
Nitrite: NEGATIVE
Protein, ur: NEGATIVE mg/dL
SPECIFIC GRAVITY, URINE: 1.014 (ref 1.005–1.030)
pH: 6 (ref 5.0–8.0)

## 2017-03-10 LAB — CBC WITH DIFFERENTIAL/PLATELET
BASOS ABS: 0 10*3/uL (ref 0–0.1)
BASOS PCT: 1 %
EOS ABS: 0.1 10*3/uL (ref 0–0.7)
Eosinophils Relative: 2 %
HEMATOCRIT: 39.3 % (ref 35.0–47.0)
Hemoglobin: 13.7 g/dL (ref 12.0–16.0)
Lymphocytes Relative: 13 %
Lymphs Abs: 0.7 10*3/uL — ABNORMAL LOW (ref 1.0–3.6)
MCH: 30 pg (ref 26.0–34.0)
MCHC: 34.8 g/dL (ref 32.0–36.0)
MCV: 86.1 fL (ref 80.0–100.0)
Monocytes Absolute: 0.7 10*3/uL (ref 0.2–0.9)
Monocytes Relative: 13 %
NEUTROS ABS: 4 10*3/uL (ref 1.4–6.5)
Neutrophils Relative %: 71 %
Platelets: 163 10*3/uL (ref 150–440)
RBC: 4.57 MIL/uL (ref 3.80–5.20)
RDW: 13.6 % (ref 11.5–14.5)
WBC: 5.6 10*3/uL (ref 3.6–11.0)

## 2017-03-10 LAB — BASIC METABOLIC PANEL
ANION GAP: 11 (ref 5–15)
BUN: 26 mg/dL — ABNORMAL HIGH (ref 6–20)
CALCIUM: 9.4 mg/dL (ref 8.9–10.3)
CHLORIDE: 100 mmol/L — AB (ref 101–111)
CO2: 27 mmol/L (ref 22–32)
Creatinine, Ser: 1.04 mg/dL — ABNORMAL HIGH (ref 0.44–1.00)
GFR calc non Af Amer: 48 mL/min — ABNORMAL LOW (ref >60)
GFR, EST AFRICAN AMERICAN: 55 mL/min — AB (ref >60)
Glucose, Bld: 105 mg/dL — ABNORMAL HIGH (ref 65–99)
Potassium: 4.1 mmol/L (ref 3.5–5.1)
Sodium: 138 mmol/L (ref 135–145)

## 2017-03-10 LAB — INFLUENZA PANEL BY PCR (TYPE A & B)
INFLAPCR: NEGATIVE
INFLBPCR: NEGATIVE

## 2017-03-10 LAB — BRAIN NATRIURETIC PEPTIDE: B Natriuretic Peptide: 712 pg/mL — ABNORMAL HIGH (ref 0.0–100.0)

## 2017-03-10 LAB — TROPONIN I: TROPONIN I: 0.03 ng/mL — AB (ref <0.03)

## 2017-03-10 MED ORDER — PREDNISONE 20 MG PO TABS
60.0000 mg | ORAL_TABLET | Freq: Once | ORAL | Status: AC
  Administered 2017-03-10: 60 mg via ORAL
  Filled 2017-03-10: qty 3

## 2017-03-10 MED ORDER — LEVOFLOXACIN 750 MG PO TABS
750.0000 mg | ORAL_TABLET | Freq: Once | ORAL | Status: DC
  Filled 2017-03-10: qty 1

## 2017-03-10 MED ORDER — LEVOFLOXACIN 500 MG PO TABS
500.0000 mg | ORAL_TABLET | Freq: Once | ORAL | Status: AC
  Administered 2017-03-10: 500 mg via ORAL
  Filled 2017-03-10: qty 1

## 2017-03-10 MED ORDER — ALBUTEROL SULFATE HFA 108 (90 BASE) MCG/ACT IN AERS: 2.0000 | INHALATION_SPRAY | Freq: Four times a day (QID) | RESPIRATORY_TRACT | 2 refills | Status: AC | PRN

## 2017-03-10 MED ORDER — PREDNISONE 20 MG PO TABS: 60.0000 mg | ORAL_TABLET | Freq: Every day | ORAL | 0 refills | Status: AC

## 2017-03-10 MED ORDER — ALBUTEROL SULFATE (2.5 MG/3ML) 0.083% IN NEBU
2.5000 mg | INHALATION_SOLUTION | Freq: Once | RESPIRATORY_TRACT | Status: AC
  Administered 2017-03-10: 2.5 mg via RESPIRATORY_TRACT
  Filled 2017-03-10: qty 3

## 2017-03-10 MED ORDER — LEVOFLOXACIN 250 MG PO TABS: 250.0000 mg | ORAL_TABLET | Freq: Every day | ORAL | 0 refills | Status: AC

## 2017-03-10 NOTE — ED Notes (Signed)
Pt assisted to toilet to urinate 

## 2017-03-10 NOTE — ED Notes (Signed)
Pt up to toilet 

## 2017-03-10 NOTE — ED Notes (Signed)
Patient transported to X-ray 

## 2017-03-10 NOTE — Discharge Instructions (Signed)
Return to the emergency department for new, worsening, or persistent weakness, difficulty breathing, high fevers, chest pain, vomiting, inability to take the medications, or any other new or worsening symptoms that concern you.  You should take the medications as prescribed, and finish the full course of the antibiotic and steroid (4 days).  You should make an appointment to follow-up with your primary care doctor within the next week.

## 2017-03-10 NOTE — ED Triage Notes (Signed)
Pt was sent from PCP office where her BP was 80/60. Pt reports that she has been feeling weak and sob since Saturday with a non productive cough.

## 2017-03-10 NOTE — ED Provider Notes (Signed)
[facility] Emergency Department Provider Note ____________________________________________   First MD Initiated Contact with Patient 03/10/17 1105     (approximate)  I have reviewed the triage vital signs and the nursing notes.   HISTORY  Chief Complaint Cough; Weakness; and Hypotension    HPI Theresa Carrillo is a 84 y.o. female past medical history as noted below including  CHF who presents with increased weakness over the last 3 days, gradual onset, worsening today, and described as generalized.  Patient also reports cough intermittently productive of sputum, and shortness of breath over the last several days.  She states that it is associated with some nasal congestion, but no GI or urinary symptoms.  She states that she was in her usual state of health before 3 days ago.  The patient states she went to her primary care doctor's office today, and was noted to be hypotensive so was referred to the emergency department.   Past Medical History:  Diagnosis Date  . Anxiety   . Atrial fibrillation ([place])   . CHF (congestive heart failure) ([place])   . HLD (hyperlipidemia)   . Hypertension   . Stroke [facility])     Patient Active Problem List   Diagnosis Date Noted  . Chronic diastolic heart failure ([place]) 07/14/2016  . Acute systolic CHF (congestive heart failure) ([place]) 06/27/2016  . AF (paroxysmal atrial fibrillation) ([place]) 06/27/2016  . Anxiety 06/27/2016  . HTN (hypertension) 06/27/2016  . HLD (hyperlipidemia) 06/27/2016    Past Surgical History:  Procedure Laterality Date  . ABDOMINAL HYSTERECTOMY    . HIP FRACTURE SURGERY    . REPLACEMENT TOTAL KNEE BILATERAL      Prior to Admission medications   Medication Sig Start Date End Date Taking? Authorizing Provider  albuterol (PROVENTIL HFA;VENTOLIN HFA) 108 (90 Base) MCG/ACT inhaler Inhale 2 puffs into the lungs every 6 (six) hours as needed for wheezing or shortness of breath. 03/10/17   Arta Silence, MD  doxepin (SINEQUAN) 10 MG capsule Take 10 mg by mouth 3 (three) times daily.     [provider]  furosemide (LASIX) 20 MG tablet Take 20 mg by mouth daily. 01/04/17   [provider]  levofloxacin (LEVAQUIN) 250 MG tablet Take 1 tablet (250 mg total) by mouth daily for 4 days. 03/11/17 03/15/17  Arta Silence, MD  losartan (COZAAR) 25 MG tablet Take 1 tablet by mouth daily. 05/18/16   [provider]  metoprolol tartrate (LOPRESSOR) 25 MG tablet Take 1 tablet (25 mg total) by mouth 2 (two) times daily. 06/28/16   Bettey Costa, MD  predniSONE (DELTASONE) 20 MG tablet Take 3 tablets (60 mg total) by mouth daily for 4 days. 03/11/17 03/15/17  Arta Silence, MD  sotalol (BETAPACE) 80 MG tablet Take 1 tablet (80 mg total) by mouth daily. 06/28/16   Bettey Costa, MD  warfarin (COUMADIN) 1 MG tablet Take 3 tablets by mouth daily.  04/20/16   [provider]  warfarin (COUMADIN) 4 MG tablet Take 4 mg by mouth one time only at 6 PM.  04/20/16   [provider]  zaleplon (SONATA) 5 MG capsule Take 1 capsule by mouth at bedtime as needed. 01/07/17   [provider]    Allergies Patient has no known allergies.  Family History  Problem Relation Age of Onset  . Heart failure Mother   . Asthma Father     Social History Social History   Tobacco Use  . Smoking status: Former  Smoker    Packs/day: 0.25    Years: 20.00    Pack years: 5.00    Types: Cigarettes  . Smokeless tobacco: Never Used  . Tobacco comment: social  Substance Use Topics  . Alcohol use: No  . Drug use: No    Review of Systems  Constitutional: Positive for chills. Eyes: No redness. ENT: Positive for nasal congestion. Cardiovascular: Denies chest pain. Respiratory: Positive for shortness of breath. Gastrointestinal: No nausea, no vomiting.  No diarrhea.  Genitourinary: Negative for dysuria.  Musculoskeletal: Negative for back pain. Skin: Negative for  rash. Neurological: Negative for headache.   ____________________________________________   PHYSICAL EXAM:  VITAL SIGNS: ED Triage Vitals  Enc Vitals Group     BP 03/10/17 1043 (!) 83/48     Pulse Rate 03/10/17 1052 66     Resp 03/10/17 1052 18     Temp 03/10/17 1052 98.3 F (36.8 C)     Temp Source 03/10/17 1052 Oral     SpO2 03/10/17 1052 94 %     Weight 03/10/17 1053 138 lb (62.6 kg)     Height 03/10/17 1053 5' (1.524 m)     Head Circumference --      Peak Flow --      Pain Score --      Pain Loc --      Pain Edu? --      Excl. in [place]? --     Constitutional: Alert and oriented.  Tired appearing but in no acute distress. Eyes: Conjunctivae are normal.  EOMI. Head: Atraumatic. Nose: No congestion/rhinnorhea. Mouth/Throat: Mucous membranes are moist.   Neck: Normal range of motion.  Cardiovascular: Normal rate, regular rhythm. Grossly normal heart sounds.  Good peripheral circulation. Respiratory: Normal respiratory effort.  No retractions.  Scattered rales and wheezes bilaterally. Gastrointestinal:  No distention.  Genitourinary: No flank tenderness. Musculoskeletal: No lower extremity edema.  Extremities warm and well perfused.  Neurologic:  Normal speech and language. No gross focal neurologic deficits are appreciated.  Skin:  Skin is warm and dry. No rash noted. Psychiatric: Mood and affect are normal. Speech and behavior are normal.  ____________________________________________   LABS (all labs ordered are listed, but only abnormal results are displayed)  Labs Reviewed  CBC WITH DIFFERENTIAL/PLATELET - Abnormal; Notable for the following components:      Result Value   Lymphs Abs 0.7 (*)    All other components within normal limits  BASIC METABOLIC PANEL - Abnormal; Notable for the following components:   Chloride 100 (*)    Glucose, Bld 105 (*)    BUN 26 (*)    Creatinine, Ser 1.04 (*)    GFR calc non Af Amer 48 (*)    GFR calc Af Amer 55 (*)    All  other components within normal limits  BRAIN NATRIURETIC PEPTIDE - Abnormal; Notable for the following components:   B Natriuretic Peptide 712.0 (*)    All other components within normal limits  TROPONIN I - Abnormal; Notable for the following components:   Troponin I 0.03 (*)    All other components within normal limits  URINALYSIS, COMPLETE (UACMP) WITH MICROSCOPIC - Abnormal; Notable for the following components:   Color, Urine YELLOW (*)    APPearance CLEAR (*)    Leukocytes, UA TRACE (*)    Bacteria, UA RARE (*)    Squamous Epithelial / LPF 0-5 (*)    All other components within normal limits  INFLUENZA PANEL BY PCR (TYPE A &  B)  TROPONIN I   ____________________________________________  EKG  ED ECG REPORT I, Arta Silence, the attending physician, personally viewed and interpreted this ECG.  Date: 03/10/2017 EKG Time: 10:53 Rate: 71 Rhythm: normal sinus rhythm QRS Axis: normal Intervals: borderline prolonged PR, RBBB, LPFB ST/T Wave abnormalities: normal Narrative Interpretation: no evidence of acute ischemia; no significant change when compared to EKG of 06/08/2013  ____________________________________________  RADIOLOGY  CXR: Chronic bronchitic changes with no acute pneumonia  ____________________________________________   PROCEDURES  Procedure(s) performed: No    Critical Care performed: No ____________________________________________   INITIAL IMPRESSION / ASSESSMENT AND PLAN / ED COURSE  Pertinent labs & imaging results that were available during my care of the patient were reviewed by me and considered in my medical decision making (see chart for details).  84 year old female with past medical history as noted above presents with generalized weakness along with productive cough and shortness of breath over the last 3 days.  Patient was noted to be hypotensive and her primary care doctor's office, and referred to the emergency department.  I  reviewed the past medical records in Epic; patient was most recently admitted approximately 7 months ago for acute respiratory distress and CHF, but has had no admissions or ED visits since then.  On exam, the patient was initially hypotensive but this resolved without intervention and the patient is now somewhat hypertensive.  Her other vital signs are normal, and her O2 sat is in the high 90s on room air.  She has no respiratory distress, but there are scattered rales and wheezes on her lung exam bilaterally.  No peripheral edema.  Overall presentation is most consistent with bronchitis versus pneumonia.  Given the normalization of vital signs, there is no evidence of sepsis acutely.  Differential also includes less likely CHF exacerbation, or other cardiac etiology.  Also consider other source of infection such as UTI or influenza/viral.  Plan: Chest x-ray, basic and cardiac labs, UA, trial of nebs and reassess.    ----------------------------------------- 2:14 PM on 03/10/2017 -----------------------------------------  X-ray consistent with bronchitis, with no focal infiltrate.  Influenza is negative, and the remainder the patient's lab workup is unremarkable; BNP is elevated but improved from prior results we have on the patient.  Patient does have a minimally indeterminate troponin.  She has no chest pain or evidence of ischemia, her EKG has no acute findings.  Patient for ACS.  Will repeat a troponin after 3 hours.  Patient reports increase in her cough after albuterol but otherwise feeling comfortable, and her blood pressures remained stable.  ----------------------------------------- 3:39 PM on 03/10/2017 -----------------------------------------  Repeat troponin is negative.  Patient's vital signs have remained stable, her O2 sat remains in the high 90s, and she appears comfortable.  Patient states that she would like to go home.  At this time the overall presentation is most consistent with  acute bronchitis.  Given the possibility of early pneumonia with x-ray lagging behind, I will prescribe a short course of levofloxacin with which patient was previously successfully treated for pneumonia.  Given patient's chronic mild renal impairment as well as the other medications she is on including warfarin, she will give be given a reduced dose and a short course of 5 days.  In addition I prescribed the patient for prednisone as well as albuterol as needed.  Patient has had no recurrent hypotension or any clinical evidence for sepsis or acute cardiac etiology.  Discharge instructions explained, and return precautions given; the patient expressed understanding.  She agrees to follow-up with her primary care doctor.  ____________________________________________   FINAL CLINICAL IMPRESSION(S) / ED DIAGNOSES  Final diagnoses:  Bronchitis  Generalized weakness      NEW MEDICATIONS STARTED DURING THIS VISIT:  Discharge Medication List as of 03/10/2017  3:16 PM    START taking these medications   Details  albuterol (PROVENTIL HFA;VENTOLIN HFA) 108 (90 Base) MCG/ACT inhaler Inhale 2 puffs into the lungs every 6 (six) hours as needed for wheezing or shortness of breath., Starting Mon 03/10/2017, Print    levofloxacin (LEVAQUIN) 250 MG tablet Take 1 tablet (250 mg total) by mouth daily for 4 days., Starting Tue 03/11/2017, Until Sat 03/15/2017, Print    predniSONE (DELTASONE) 20 MG tablet Take 3 tablets (60 mg total) by mouth daily for 4 days., Starting Tue 03/11/2017, Until Sat 03/15/2017, Print         Note:  This document was prepared using Dragon voice recognition software and may include unintentional dictation errors.    Arta Silence, MD 03/10/17 [phone]

## 2017-03-18 ENCOUNTER — Emergency Department
Admission: EM | Admit: 2017-03-18 | Discharge: 2017-03-18 | Disposition: A | Discharge status: Home / Self Care | Payer: Medicare Other | Attending: Emergency Medicine | Admitting: Emergency Medicine

## 2017-03-18 ENCOUNTER — Encounter: Payer: Self-pay | Admitting: Emergency Medicine

## 2017-03-18 ENCOUNTER — Emergency Department: Payer: Medicare Other

## 2017-03-18 ENCOUNTER — Other Ambulatory Visit: Payer: Self-pay

## 2017-03-18 DIAGNOSIS — K529 Noninfective gastroenteritis and colitis, unspecified: Secondary | ICD-10-CM | POA: Diagnosis not present

## 2017-03-18 DIAGNOSIS — R5383 Other fatigue: Secondary | ICD-10-CM | POA: Diagnosis present

## 2017-03-18 DIAGNOSIS — Z87891 Personal history of nicotine dependence: Secondary | ICD-10-CM | POA: Insufficient documentation

## 2017-03-18 DIAGNOSIS — I110 Hypertensive heart disease with heart failure: Secondary | ICD-10-CM | POA: Insufficient documentation

## 2017-03-18 DIAGNOSIS — Z7901 Long term (current) use of anticoagulants: Secondary | ICD-10-CM | POA: Insufficient documentation

## 2017-03-18 DIAGNOSIS — I5032 Chronic diastolic (congestive) heart failure: Secondary | ICD-10-CM | POA: Diagnosis not present

## 2017-03-18 DIAGNOSIS — Z79899 Other long term (current) drug therapy: Secondary | ICD-10-CM | POA: Diagnosis not present

## 2017-03-18 LAB — COMPREHENSIVE METABOLIC PANEL
ALBUMIN: 3.9 g/dL (ref 3.5–5.0)
ALT: 12 U/L — AB (ref 14–54)
AST: 21 U/L (ref 15–41)
Alkaline Phosphatase: 47 U/L (ref 38–126)
Anion gap: 9 (ref 5–15)
BUN: 26 mg/dL — AB (ref 6–20)
CHLORIDE: 101 mmol/L (ref 101–111)
CO2: 28 mmol/L (ref 22–32)
CREATININE: 1.12 mg/dL — AB (ref 0.44–1.00)
Calcium: 9.4 mg/dL (ref 8.9–10.3)
GFR calc Af Amer: 50 mL/min — ABNORMAL LOW (ref >60)
GFR, EST NON AFRICAN AMERICAN: 44 mL/min — AB (ref >60)
Glucose, Bld: 88 mg/dL (ref 65–99)
POTASSIUM: 3.6 mmol/L (ref 3.5–5.1)
SODIUM: 138 mmol/L (ref 135–145)
Total Bilirubin: 1.6 mg/dL — ABNORMAL HIGH (ref 0.3–1.2)
Total Protein: 7.1 g/dL (ref 6.5–8.1)

## 2017-03-18 LAB — URINALYSIS, COMPLETE (UACMP) WITH MICROSCOPIC
BILIRUBIN URINE: NEGATIVE
Glucose, UA: NEGATIVE mg/dL
Hgb urine dipstick: NEGATIVE
KETONES UR: 5 mg/dL — AB
Nitrite: NEGATIVE
Protein, ur: 30 mg/dL — AB
SPECIFIC GRAVITY, URINE: 1.016 (ref 1.005–1.030)
pH: 5 (ref 5.0–8.0)

## 2017-03-18 LAB — CBC
HEMATOCRIT: 40 % (ref 35.0–47.0)
Hemoglobin: 13.5 g/dL (ref 12.0–16.0)
MCH: 29.2 pg (ref 26.0–34.0)
MCHC: 33.7 g/dL (ref 32.0–36.0)
MCV: 86.6 fL (ref 80.0–100.0)
Platelets: 264 10*3/uL (ref 150–440)
RBC: 4.62 MIL/uL (ref 3.80–5.20)
RDW: 13.8 % (ref 11.5–14.5)
WBC: 14.3 10*3/uL — AB (ref 3.6–11.0)

## 2017-03-18 LAB — LIPASE, BLOOD: LIPASE: 28 U/L (ref 11–51)

## 2017-03-18 MED ORDER — AMOXICILLIN-POT CLAVULANATE 875-125 MG PO TABS
1.0000 | ORAL_TABLET | Freq: Once | ORAL | Status: AC
  Administered 2017-03-18: 1 via ORAL
  Filled 2017-03-18: qty 1

## 2017-03-18 MED ORDER — AMOXICILLIN-POT CLAVULANATE 875-125 MG PO TABS: 1.0000 | ORAL_TABLET | Freq: Two times a day (BID) | ORAL | 0 refills | Status: AC

## 2017-03-18 MED ORDER — LOPERAMIDE HCL 2 MG PO CAPS
2.0000 mg | ORAL_CAPSULE | Freq: Once | ORAL | Status: AC
  Administered 2017-03-18: 2 mg via ORAL
  Filled 2017-03-18: qty 1

## 2017-03-18 MED ORDER — IOPAMIDOL (ISOVUE-300) INJECTION 61%
75.0000 mL | Freq: Once | INTRAVENOUS | Status: AC | PRN
  Administered 2017-03-18: 75 mL via INTRAVENOUS

## 2017-03-18 MED ORDER — SODIUM CHLORIDE 0.9 % IV BOLUS (SEPSIS)
1000.0000 mL | Freq: Once | INTRAVENOUS | Status: AC
  Administered 2017-03-18: 1000 mL via INTRAVENOUS

## 2017-03-18 NOTE — ED Triage Notes (Signed)
Weakness, feels sick. Pt was just here last week dx with bronchitis and is not any better and now having bloody diarrhea.

## 2017-03-18 NOTE — ED Provider Notes (Signed)
[facility] Emergency Department Provider Note  ____________________________________________   First MD Initiated Contact with Patient 03/18/17 1843     (approximate)  I have reviewed the triage vital signs and the nursing notes.   HISTORY  Chief Complaint Fatigue   HPI Theresa Carrillo is a 84 y.o. female is self presents the emergency department with weakness fatigue malaise and bloody diarrhea that began today.  She has had multiple episodes of bloody diarrhea along with cramping moderate severity intermittent nonradiating lower abdominal discomfort.  She recently completed a course of Levaquin for bronchitis and feels like she never completely got better.  She has had some nausea but no vomiting.  Past Medical History:  Diagnosis Date  . Anxiety   . Atrial fibrillation ([place])   . CHF (congestive heart failure) ([place])   . HLD (hyperlipidemia)   . Hypertension   . Stroke [facility])     Patient Active Problem List   Diagnosis Date Noted  . Chronic diastolic heart failure ([place]) 07/14/2016  . Acute systolic CHF (congestive heart failure) ([place]) 06/27/2016  . AF (paroxysmal atrial fibrillation) ([place]) 06/27/2016  . Anxiety 06/27/2016  . HTN (hypertension) 06/27/2016  . HLD (hyperlipidemia) 06/27/2016    Past Surgical History:  Procedure Laterality Date  . ABDOMINAL HYSTERECTOMY    . HIP FRACTURE SURGERY    . REPLACEMENT TOTAL KNEE BILATERAL      Prior to Admission medications   Medication Sig Start Date End Date Taking? Authorizing Provider  albuterol (PROVENTIL HFA;VENTOLIN HFA) 108 (90 Base) MCG/ACT inhaler Inhale 2 puffs into the lungs every 6 (six) hours as needed for wheezing or shortness of breath. 03/10/17   Arta Silence, MD  amoxicillin-clavulanate (AUGMENTIN) 875-125 MG tablet Take 1 tablet by mouth 2 (two) times daily for 7 days. 03/18/17 03/25/17  Darel Hong, MD  doxepin (SINEQUAN) 10 MG capsule Take 10 mg by mouth 3 (three) times  daily.     [provider]  furosemide (LASIX) 20 MG tablet Take 20 mg by mouth daily. 01/04/17   [provider]  losartan (COZAAR) 25 MG tablet Take 1 tablet by mouth daily. 05/18/16   [provider]  metoprolol tartrate (LOPRESSOR) 25 MG tablet Take 1 tablet (25 mg total) by mouth 2 (two) times daily. 06/28/16   Bettey Costa, MD  sotalol (BETAPACE) 80 MG tablet Take 1 tablet (80 mg total) by mouth daily. 06/28/16   Bettey Costa, MD  warfarin (COUMADIN) 1 MG tablet Take 3 tablets by mouth daily.  04/20/16   [provider]  warfarin (COUMADIN) 4 MG tablet Take 4 mg by mouth one time only at 6 PM.  04/20/16   [provider]  zaleplon (SONATA) 5 MG capsule Take 1 capsule by mouth at bedtime as needed. 01/07/17   [provider]    Allergies Patient has no known allergies.  Family History  Problem Relation Age of Onset  . Heart failure Mother   . Asthma Father     Social History Social History   Tobacco Use  . Smoking status: Former Smoker    Packs/day: 0.25    Years: 20.00    Pack years: 5.00    Types: Cigarettes  . Smokeless tobacco: Never Used  . Tobacco comment: social  Substance Use Topics  . Alcohol use: No  . Drug use: No    Review of Systems Constitutional: No fever/chills Eyes: No visual changes. ENT: No sore throat. Cardiovascular: Denies chest pain. Respiratory: Denies  shortness of breath. Gastrointestinal: Positive for abdominal pain.  Positive for nausea, no vomiting.  Positive for diarrhea.  No constipation. Genitourinary: Negative for dysuria. Musculoskeletal: Negative for back pain. Skin: Negative for rash. Neurological: Negative for headaches, focal weakness or numbness.   ____________________________________________   PHYSICAL EXAM:  VITAL SIGNS: ED Triage Vitals  Enc Vitals Group     BP 03/18/17 1704 (!) 122/40     Pulse Rate 03/18/17 1704 71     Resp 03/18/17 1704 20     Temp 03/18/17 1708  98.8 F (37.1 C)     Temp Source 03/18/17 1704 Oral     SpO2 03/18/17 1704 100 %     Weight 03/18/17 1705 138 lb (62.6 kg)     Height --      Head Circumference --      Peak Flow --      Pain Score 03/18/17 1705 3     Pain Loc --      Pain Edu? --      Excl. in [place]? --     Constitutional: Alert and oriented x4 joking laughing pleasant cooperative speaks in full clear sentences no diaphoresis Eyes: PERRL EOMI. Head: Atraumatic. Nose: No congestion/rhinnorhea. Mouth/Throat: No trismus Neck: No stridor.   Cardiovascular: Normal rate, regular rhythm. Grossly normal heart sounds.  Good peripheral circulation. Respiratory: Normal respiratory effort.  No retractions. Lungs CTAB and moving good air Gastrointestinal: Soft abdomen mild lower abdominal tenderness with no rebound or guarding no peritonitis Musculoskeletal: No lower extremity edema   Neurologic:  Normal speech and language. No gross focal neurologic deficits are appreciated. Skin:  Skin is warm, dry and intact. No rash noted. Psychiatric: Mood and affect are normal. Speech and behavior are normal.    ____________________________________________   DIFFERENTIAL includes but not limited to  Appendicitis, diverticulitis, small bowel obstruction, volvulus, pyelonephritis, nephrolithiasis ____________________________________________   LABS (all labs ordered are listed, but only abnormal results are displayed)  Labs Reviewed  COMPREHENSIVE METABOLIC PANEL - Abnormal; Notable for the following components:      Result Value   BUN 26 (*)    Creatinine, Ser 1.12 (*)    ALT 12 (*)    Total Bilirubin 1.6 (*)    GFR calc non Af Amer 44 (*)    GFR calc Af Amer 50 (*)    All other components within normal limits  CBC - Abnormal; Notable for the following components:   WBC 14.3 (*)    All other components within normal limits  URINALYSIS, COMPLETE (UACMP) WITH MICROSCOPIC - Abnormal; Notable for the following components:    Color, Urine YELLOW (*)    APPearance CLOUDY (*)    Ketones, ur 5 (*)    Protein, ur 30 (*)    Leukocytes, UA LARGE (*)    Bacteria, UA RARE (*)    Squamous Epithelial / LPF 0-5 (*)    All other components within normal limits  LIPASE, BLOOD    Lab work reviewed by me with elevated white count which is nonspecific __________________________________________  EKG  ED ECG REPORT I, Darel Hong, the attending physician, personally viewed and interpreted this ECG.  Date: 03/19/2017 EKG Time:  Rate: 77 Rhythm: normal sinus rhythm QRS Axis: Rightward Axis Intervals: First-degree AV block ST/T Wave abnormalities: normal Narrative Interpretation: no evidence of acute ischemia  ____________________________________________  RADIOLOGY  CT abdomen pelvis reviewed by me consistent with colitis ____________________________________________   PROCEDURES  Procedure(s) performed: o  Procedures  Critical Care  performed: no  Observation: no ____________________________________________   INITIAL IMPRESSION / ASSESSMENT AND PLAN / ED COURSE  Pertinent labs & imaging results that were available during my care of the patient were reviewed by me and considered in my medical decision making (see chart for details).  The patient arrives hemodynamically stable well-appearing although with a tender abdomen.  Differential an 85 year old with abdominal pain and diarrhea is broad so CT abdomen pelvis obtained which shows distal colitis.  We will give a short course of Augmentin and refer back to primary care.  Strict return precautions have been given and the patient verbalizes understanding and agree with the plan.      ____________________________________________   FINAL CLINICAL IMPRESSION(S) / ED DIAGNOSES  Final diagnoses:  Colitis      NEW MEDICATIONS STARTED DURING THIS VISIT:  Discharge Medication List as of 03/18/2017  8:09 PM    START taking these medications    Details  amoxicillin-clavulanate (AUGMENTIN) 875-125 MG tablet Take 1 tablet by mouth 2 (two) times daily for 7 days., Starting Tue 03/18/2017, Until Tue 03/25/2017, Print         Note:  This document was prepared using Dragon voice recognition software and may include unintentional dictation errors.     Darel Hong, MD 03/19/17 2231

## 2017-03-18 NOTE — Discharge Instructions (Signed)
Please take all of your antibiotics as prescribed and follow-up with your primary care physician in 2 days for reevaluation.  Return to the emergency department sooner for any concerns.  It was a pleasure to take care of you today, and thank you for coming to our emergency department.  If you have any questions or concerns before leaving please ask the nurse to grab me and I'm more than happy to go through your aftercare instructions again.  If you were prescribed any opioid pain medication today such as Norco, Vicodin, Percocet, morphine, hydrocodone, or oxycodone please make sure you do not drive when you are taking this medication as it can alter your ability to drive safely.  If you have any concerns once you are home that you are not improving or are in fact getting worse before you can make it to your follow-up appointment, please do not hesitate to call 911 and come back for further evaluation.  Darel Hong, MD  Results for orders placed or performed during the hospital encounter of 03/18/17  Lipase, blood  Result Value Ref Range   Lipase 28 11 - 51 U/L  Comprehensive metabolic panel  Result Value Ref Range   Sodium 138 135 - 145 mmol/L   Potassium 3.6 3.5 - 5.1 mmol/L   Chloride 101 101 - 111 mmol/L   CO2 28 22 - 32 mmol/L   Glucose, Bld 88 65 - 99 mg/dL   BUN 26 (H) 6 - 20 mg/dL   Creatinine, Ser 1.12 (H) 0.44 - 1.00 mg/dL   Calcium 9.4 8.9 - 10.3 mg/dL   Total Protein 7.1 6.5 - 8.1 g/dL   Albumin 3.9 3.5 - 5.0 g/dL   AST 21 15 - 41 U/L   ALT 12 (L) 14 - 54 U/L   Alkaline Phosphatase 47 38 - 126 U/L   Total Bilirubin 1.6 (H) 0.3 - 1.2 mg/dL   GFR calc non Af Amer 44 (L) >60 mL/min   GFR calc Af Amer 50 (L) >60 mL/min   Anion gap 9 5 - 15  CBC  Result Value Ref Range   WBC 14.3 (H) 3.6 - 11.0 K/uL   RBC 4.62 3.80 - 5.20 MIL/uL   Hemoglobin 13.5 12.0 - 16.0 g/dL   HCT 40.0 35.0 - 47.0 %   MCV 86.6 80.0 - 100.0 fL   MCH 29.2 26.0 - 34.0 pg   MCHC 33.7 32.0 - 36.0  g/dL   RDW 13.8 11.5 - 14.5 %   Platelets 264 150 - 440 K/uL  Urinalysis, Complete w Microscopic  Result Value Ref Range   Color, Urine YELLOW (A) YELLOW   APPearance CLOUDY (A) CLEAR   Specific Gravity, Urine 1.016 1.005 - 1.030   pH 5.0 5.0 - 8.0   Glucose, UA NEGATIVE NEGATIVE mg/dL   Hgb urine dipstick NEGATIVE NEGATIVE   Bilirubin Urine NEGATIVE NEGATIVE   Ketones, ur 5 (A) NEGATIVE mg/dL   Protein, ur 30 (A) NEGATIVE mg/dL   Nitrite NEGATIVE NEGATIVE   Leukocytes, UA LARGE (A) NEGATIVE   RBC / HPF 6-30 0 - 5 RBC/hpf   WBC, UA TOO NUMEROUS TO COUNT 0 - 5 WBC/hpf   Bacteria, UA RARE (A) NONE SEEN   Squamous Epithelial / LPF 0-5 (A) NONE SEEN   Mucus PRESENT    Dg Chest 2 View  Result Date: 03/18/2017 CLINICAL DATA:  Weakness. EXAM: CHEST  2 VIEW COMPARISON:  March 02, 2017 FINDINGS: The heart, hila, mediastinum, lungs, and pleura  demonstrate no acute abnormalities. IMPRESSION: No active cardiopulmonary disease. Electronically Signed   By: Dorise Bullion III M.D   On: 03/18/2017 17:41   Dg Chest 2 View  Result Date: 03/10/2017 CLINICAL DATA:  Four days of cough, weakness, and lightheadedness. History of CHF, former smoker. EXAM: CHEST  2 VIEW COMPARISON:  Portable chest x-ray of Jun 28, 2016. FINDINGS: The lungs are well-expanded. The interstitial markings are coarse. There is no alveolar infiltrate or pleural effusion. The heart is top-normal in size. The pulmonary vascularity is normal. There is dense calcification in the mitral valvular annulus. There calcification in the wall of the thoracic aorta. There is no compression fracture of the thoracic spine but there multilevel degenerative disc space narrowing. There is mild loss of height of the body of L2. IMPRESSION: Chronic bronchitic changes. No acute pneumonia. Top-normal cardiac size and central pulmonary vascular prominence without significant pulmonary edema. Thoracic aortic atherosclerosis. Electronically Signed   By: David   Martinique M.D.   On: 03/10/2017 11:42   Ct Abdomen Pelvis W Contrast  Result Date: 03/18/2017 CLINICAL DATA:  Diarrhea, weakness. EXAM: CT ABDOMEN AND PELVIS WITH CONTRAST TECHNIQUE: Multidetector CT imaging of the abdomen and pelvis was performed using the standard protocol following bolus administration of intravenous contrast. CONTRAST:  21mL ISOVUE-300 IOPAMIDOL (ISOVUE-300) INJECTION 61% COMPARISON:  None. FINDINGS: Lower chest: No acute abnormality. Hepatobiliary: Solitary gallstone is noted. No inflammation is noted. The liver is unremarkable. Pancreas: Unremarkable. No pancreatic ductal dilatation or surrounding inflammatory changes. Spleen: Normal in size without focal abnormality. Adrenals/Urinary Tract: Adrenal glands appear normal. Bilateral simple renal cysts are noted. No hydronephrosis or renal obstruction is noted. Nonobstructive left nephrolithiasis is noted. No ureteral calculi are noted. Urinary bladder is unremarkable. Stomach/Bowel: The appendix appears normal. The stomach appears normal. Wall thickening of descending and sigmoid colon is noted, consistent with infectious or inflammatory colitis. Vascular/Lymphatic: Aortic atherosclerosis. No enlarged abdominal or pelvic lymph nodes. Reproductive: Status post hysterectomy. No adnexal masses. Other: No abdominal wall hernia or abnormality. No abdominopelvic ascites. Musculoskeletal: Old L2 compression fracture is noted. No acute abnormality is noted. IMPRESSION: Wall thickening of descending and sigmoid colon is noted concerning for infectious or inflammatory colitis. Cholelithiasis without inflammation. Nonobstructive left nephrolithiasis. No hydronephrosis or renal obstruction is noted. Aortic atherosclerosis. Electronically Signed   By: Marijo Conception, M.D.   On: 03/18/2017 19:54

## 2017-03-18 NOTE — ED Notes (Signed)
First nurse note  Brought over from [facility] .  Fatigue,blurred vision and diarrhea   Bloody diarrhea since last pm

## 2017-07-23 ENCOUNTER — Encounter: Payer: Self-pay | Admitting: *Deleted

## 2017-07-31 ENCOUNTER — Ambulatory Visit
Admission: RE | Admit: 2017-07-31 | Discharge: 2017-07-31 | Disposition: A | Discharge status: Home / Self Care | Payer: Medicare Other | Source: Ambulatory Visit | Attending: Ophthalmology | Admitting: Ophthalmology

## 2017-07-31 ENCOUNTER — Ambulatory Visit: Payer: Medicare Other | Admitting: Certified Registered Nurse Anesthetist

## 2017-07-31 ENCOUNTER — Encounter
Admission: RE | Disposition: A | Discharge status: Home / Self Care | Payer: Self-pay | Source: Ambulatory Visit | Attending: Ophthalmology

## 2017-07-31 DIAGNOSIS — I110 Hypertensive heart disease with heart failure: Secondary | ICD-10-CM | POA: Insufficient documentation

## 2017-07-31 DIAGNOSIS — M1990 Unspecified osteoarthritis, unspecified site: Secondary | ICD-10-CM | POA: Insufficient documentation

## 2017-07-31 DIAGNOSIS — H2511 Age-related nuclear cataract, right eye: Secondary | ICD-10-CM | POA: Diagnosis not present

## 2017-07-31 DIAGNOSIS — I050 Rheumatic mitral stenosis: Secondary | ICD-10-CM | POA: Diagnosis not present

## 2017-07-31 DIAGNOSIS — Z87891 Personal history of nicotine dependence: Secondary | ICD-10-CM | POA: Insufficient documentation

## 2017-07-31 DIAGNOSIS — I509 Heart failure, unspecified: Secondary | ICD-10-CM | POA: Diagnosis not present

## 2017-07-31 DIAGNOSIS — I4891 Unspecified atrial fibrillation: Secondary | ICD-10-CM | POA: Insufficient documentation

## 2017-07-31 DIAGNOSIS — Z7901 Long term (current) use of anticoagulants: Secondary | ICD-10-CM | POA: Insufficient documentation

## 2017-07-31 DIAGNOSIS — F329 Major depressive disorder, single episode, unspecified: Secondary | ICD-10-CM | POA: Diagnosis not present

## 2017-07-31 DIAGNOSIS — Z79899 Other long term (current) drug therapy: Secondary | ICD-10-CM | POA: Insufficient documentation

## 2017-07-31 DIAGNOSIS — Z96653 Presence of artificial knee joint, bilateral: Secondary | ICD-10-CM | POA: Insufficient documentation

## 2017-07-31 DIAGNOSIS — F419 Anxiety disorder, unspecified: Secondary | ICD-10-CM | POA: Insufficient documentation

## 2017-07-31 HISTORY — PX: CATARACT EXTRACTION W/PHACO: SHX586

## 2017-07-31 HISTORY — DX: Unspecified osteoarthritis, unspecified site: M19.90

## 2017-07-31 HISTORY — DX: Cardiac arrhythmia, unspecified: I49.9

## 2017-07-31 HISTORY — DX: Depression, unspecified: F32.A

## 2017-07-31 HISTORY — DX: Chronic kidney disease, unspecified: N18.9

## 2017-07-31 HISTORY — DX: Rheumatic mitral stenosis: I05.0

## 2017-07-31 HISTORY — DX: Major depressive disorder, single episode, unspecified: F32.9

## 2017-07-31 SURGERY — PHACOEMULSIFICATION, CATARACT, WITH IOL INSERTION
Anesthesia: Monitor Anesthesia Care | Site: Eye | Laterality: Right | Wound class: "Clean "

## 2017-07-31 MED ORDER — ARMC OPHTHALMIC DILATING DROPS
OPHTHALMIC | Status: AC
  Administered 2017-07-31: 1 via OPHTHALMIC
  Filled 2017-07-31: qty 0.4

## 2017-07-31 MED ORDER — ARMC OPHTHALMIC DILATING DROPS
1.0000 "application " | OPHTHALMIC | Status: AC
  Administered 2017-07-31 (×3): 1 via OPHTHALMIC

## 2017-07-31 MED ORDER — LIDOCAINE HCL (PF) 4 % IJ SOLN
INTRAOCULAR | Status: DC | PRN
  Administered 2017-07-31: 4 mL via OPHTHALMIC

## 2017-07-31 MED ORDER — MOXIFLOXACIN HCL 0.5 % OP SOLN: 1.0000 [drp] | OPHTHALMIC | Status: DC | PRN

## 2017-07-31 MED ORDER — FENTANYL CITRATE (PF) 100 MCG/2ML IJ SOLN
INTRAMUSCULAR | Status: AC
  Filled 2017-07-31: qty 2

## 2017-07-31 MED ORDER — LIDOCAINE HCL (PF) 4 % IJ SOLN
INTRAMUSCULAR | Status: AC
  Filled 2017-07-31: qty 5

## 2017-07-31 MED ORDER — EPINEPHRINE PF 1 MG/ML IJ SOLN
INTRAOCULAR | Status: DC | PRN
  Administered 2017-07-31: 11:00:00 via OPHTHALMIC

## 2017-07-31 MED ORDER — MOXIFLOXACIN HCL 0.5 % OP SOLN
OPHTHALMIC | Status: DC | PRN
  Administered 2017-07-31: 0.2 mL via OPHTHALMIC

## 2017-07-31 MED ORDER — CARBACHOL 0.01 % IO SOLN
INTRAOCULAR | Status: DC | PRN
  Administered 2017-07-31: 0.5 mL via INTRAOCULAR

## 2017-07-31 MED ORDER — NA CHONDROIT SULF-NA HYALURON 40-17 MG/ML IO SOLN
INTRAOCULAR | Status: AC
  Filled 2017-07-31: qty 1

## 2017-07-31 MED ORDER — MOXIFLOXACIN HCL 0.5 % OP SOLN
OPHTHALMIC | Status: AC
  Filled 2017-07-31: qty 3

## 2017-07-31 MED ORDER — FENTANYL CITRATE (PF) 100 MCG/2ML IJ SOLN
INTRAMUSCULAR | Status: DC | PRN
  Administered 2017-07-31: 25 ug via INTRAVENOUS

## 2017-07-31 MED ORDER — EPINEPHRINE PF 1 MG/ML IJ SOLN
INTRAMUSCULAR | Status: AC
  Filled 2017-07-31: qty 2

## 2017-07-31 MED ORDER — SODIUM CHLORIDE 0.9 % IV SOLN
INTRAVENOUS | Status: DC
  Administered 2017-07-31: 11:00:00 via INTRAVENOUS

## 2017-07-31 MED ORDER — NA CHONDROIT SULF-NA HYALURON 40-17 MG/ML IO SOLN
INTRAOCULAR | Status: DC | PRN
  Administered 2017-07-31: 1 mL via INTRAOCULAR

## 2017-07-31 MED ORDER — POVIDONE-IODINE 5 % OP SOLN
OPHTHALMIC | Status: DC | PRN
  Administered 2017-07-31: 1 via OPHTHALMIC

## 2017-07-31 MED ORDER — POVIDONE-IODINE 5 % OP SOLN
OPHTHALMIC | Status: AC
  Filled 2017-07-31: qty 30

## 2017-07-31 SURGICAL SUPPLY — 16 items
GLOVE BIO SURGEON STRL SZ8 (GLOVE) ×2 IMPLANT
GLOVE BIOGEL M 6.5 STRL (GLOVE) ×2 IMPLANT
GLOVE SURG LX 8.0 MICRO (GLOVE) ×1
GLOVE SURG LX STRL 8.0 MICRO (GLOVE) ×1 IMPLANT
GOWN STRL REUS W/ TWL LRG LVL3 (GOWN DISPOSABLE) ×2 IMPLANT
GOWN STRL REUS W/TWL LRG LVL3 (GOWN DISPOSABLE) ×2
LABEL CATARACT MEDS ST (LABEL) ×2 IMPLANT
LENS IOL TECNIS ITEC 19.5 (Intraocular Lens) ×1 IMPLANT
PACK CATARACT (MISCELLANEOUS) ×2 IMPLANT
PACK CATARACT BRASINGTON LX (MISCELLANEOUS) ×2 IMPLANT
PACK EYE AFTER SURG (MISCELLANEOUS) ×2 IMPLANT
SOL BSS BAG (MISCELLANEOUS) ×2
SOLUTION BSS BAG (MISCELLANEOUS) ×1 IMPLANT
SYR 5ML LL (SYRINGE) ×2 IMPLANT
WATER STERILE IRR 250ML POUR (IV SOLUTION) ×2 IMPLANT
WIPE NON LINTING 3.25X3.25 (MISCELLANEOUS) ×2 IMPLANT

## 2017-07-31 NOTE — Anesthesia Preprocedure Evaluation (Signed)
Anesthesia Evaluation  Patient identified by MRN, date of birth, ID band Patient awake    Reviewed: Allergy & Precautions, H&P , NPO status , reviewed documented beta blocker date and time   Airway Mallampati: II  TM Distance: >3 FB Neck ROM: full    Dental  (+) Chipped   Pulmonary former smoker,    Pulmonary exam normal        Cardiovascular hypertension, +CHF  Normal cardiovascular exam+ dysrhythmias   2018 ECHO Study Conclusions  - Left ventricle: There was mild concentric hypertrophy. Systolic   function was vigorous. The estimated ejection fraction was in the   range of 65% to 70%. - Mitral valve: There was mild regurgitation. - Left atrium: The atrium was mildly dilated.   Neuro/Psych PSYCHIATRIC DISORDERS Anxiety Depression CVA    GI/Hepatic neg GERD  ,  Endo/Other    Renal/GU Renal disease     Musculoskeletal  (+) Arthritis ,   Abdominal   Peds  Hematology   Anesthesia Other Findings Past Medical History: No date: Anxiety No date: Arthritis No date: Atrial fibrillation (HCC) No date: CHF (congestive heart failure) (HCC) No date: Chronic kidney disease     Comment:  RENAL INSUFF No date: Depression No date: Dysrhythmia No date: HLD (hyperlipidemia) No date: Hypertension No date: Mitral valve stenosis No date: Stroke [facility])  Past Surgical History: No date: ABDOMINAL HYSTERECTOMY No date: FRACTURE SURGERY No date: HIP FRACTURE SURGERY No date: JOINT REPLACEMENT No date: REPLACEMENT TOTAL KNEE BILATERAL  BMI    Body Mass Index:  29.29 kg/m      Reproductive/Obstetrics                             Anesthesia Physical Anesthesia Plan  ASA: III  Anesthesia Plan: MAC   Post-op Pain Management:    Induction:   PONV Risk Score and Plan: 2 and Treatment may vary due to age or medical condition and TIVA  Airway Management Planned:   Additional Equipment:    Intra-op Plan:   Post-operative Plan:   Informed Consent: I have reviewed the patients History and Physical, chart, labs and discussed the procedure including the risks, benefits and alternatives for the proposed anesthesia with the patient or authorized representative who has indicated his/her understanding and acceptance.   Dental Advisory Given  Plan Discussed with: CRNA  Anesthesia Plan Comments:         Anesthesia Quick Evaluation

## 2017-07-31 NOTE — H&P (Signed)
All labs reviewed. Abnormal studies sent to patients PCP when indicated.  Previous H&P reviewed, patient examined, there are NO CHANGES.  Theresa Gagner Porfilio6/20/201910:51 AM

## 2017-07-31 NOTE — Anesthesia Procedure Notes (Signed)
Performed by: Demetrius Charity, CRNA Pre-anesthesia Checklist: Patient identified, Emergency Drugs available, Suction available, Patient being monitored and Timeout performed Oxygen Delivery Method: Nasal cannula

## 2017-07-31 NOTE — Discharge Instructions (Signed)
Eye Surgery Discharge Instructions  Expect mild scratchy sensation or mild soreness. DO NOT RUB YOUR EYE!  The day of surgery:  Minimal physical activity, but bed rest is not required  No reading, computer work, or close hand work  No bending, lifting, or straining.  May watch TV  For 24 hours:  No driving, legal decisions, or alcoholic beverages  Safety precautions  Eat anything you prefer: It is better to start with liquids, then soup then solid foods.  _____ Eye patch should be worn until postoperative exam tomorrow.  ____ Solar shield eyeglasses should be worn for comfort in the sunlight/patch while sleeping  Resume all regular medications including aspirin or Coumadin if these were discontinued prior to surgery. You may shower, bathe, shave, or wash your hair. Tylenol may be taken for mild discomfort.  Call your doctor if you experience significant pain, nausea, or vomiting, fever > 101 or other signs of infection. [phone] or [phone] Specific instructions:  Follow-up Information    Birder Robson, MD Follow up.   Specialty:  Ophthalmology Why:  June 20 at 2:30pm Contact information: [address] [place] [address] [phone]

## 2017-07-31 NOTE — Anesthesia Postprocedure Evaluation (Signed)
Anesthesia Post Note  Patient: Theresa Carrillo  Procedure(s) Performed: CATARACT EXTRACTION PHACO AND INTRAOCULAR LENS PLACEMENT (IOC) (Right Eye)  Patient location during evaluation: PACU Anesthesia Type: MAC Level of consciousness: awake and alert and oriented Pain management: satisfactory to patient Vital Signs Assessment: post-procedure vital signs reviewed and stable Respiratory status: respiratory function stable Cardiovascular status: stable Anesthetic complications: no     Last Vitals:  Vitals:   07/31/17 0915  BP: (!) 178/82  Pulse: 77  Resp: 17  Temp: (!) 36.3 C  SpO2: 97%    Last Pain:  Vitals:   07/31/17 0915  TempSrc: Tympanic  PainSc: 0-No pain                 Blima Singer

## 2017-07-31 NOTE — Anesthesia Post-op Follow-up Note (Signed)
Anesthesia QCDR form completed.        

## 2017-07-31 NOTE — Op Note (Signed)
PREOPERATIVE DIAGNOSIS:  Nuclear sclerotic cataract of the right eye.   POSTOPERATIVE DIAGNOSIS:  NUCLEAR SCLEROTIC CATARACT RIGHT EYE   OPERATIVE PROCEDURE: Procedure(s): CATARACT EXTRACTION PHACO AND INTRAOCULAR LENS PLACEMENT (IOC)   SURGEON:  Birder Robson, MD.   ANESTHESIA:  Anesthesiologist: Alphonsus Sias, MD CRNA: Demetrius Charity, CRNA  1.      Managed anesthesia care. 2.      0.43ml of Shugarcaine was instilled in the eye following the paracentesis.   COMPLICATIONS:  None.   TECHNIQUE:   Stop and chop   DESCRIPTION OF PROCEDURE:  The patient was examined and consented in the preoperative holding area where the aforementioned topical anesthesia was applied to the right eye and then brought back to the Operating Room where the right eye was prepped and draped in the usual sterile ophthalmic fashion and a lid speculum was placed. A paracentesis was created with the side port blade and the anterior chamber was filled with viscoelastic. A near clear corneal incision was performed with the steel keratome. A continuous curvilinear capsulorrhexis was performed with a cystotome followed by the capsulorrhexis forceps. Hydrodissection and hydrodelineation were carried out with BSS on a blunt cannula. The lens was removed in a stop and chop  technique and the remaining cortical material was removed with the irrigation-aspiration handpiece. The capsular bag was inflated with viscoelastic and the Technis [id number]  lens was placed in the capsular bag without complication. The remaining viscoelastic was removed from the eye with the irrigation-aspiration handpiece. The wounds were hydrated. The anterior chamber was flushed with Miostat and the eye was inflated to physiologic pressure. 0.46ml of Vigamox was placed in the anterior chamber. The wounds were found to be water tight. The eye was dressed with Vigamox. The patient was given protective glasses to wear throughout the day and a shield with which  to sleep tonight. The patient was also given drops with which to begin a drop regimen today and will follow-up with me in one day. Implant Name Type Inv. Item Serial No. Manufacturer Lot No. LRB No. Used  LENS IOL DIOP 19.5 - [id number] Intraocular Lens LENS IOL DIOP 19.5 [id number] AMO  Right 1   Procedure(s) with comments: CATARACT EXTRACTION PHACO AND INTRAOCULAR LENS PLACEMENT (IOC) (Right) - [place] 00:33.8 AP% 15.7 CDE 5.28 Fluid pack lot # [id number]  Electronically signed: Birder Robson 07/31/2017 11:16 AM

## 2017-07-31 NOTE — Transfer of Care (Signed)
Immediate Anesthesia Transfer of Care Note  Patient: Theresa Carrillo  Procedure(s) Performed: CATARACT EXTRACTION PHACO AND INTRAOCULAR LENS PLACEMENT (IOC) (Right Eye)  Patient Location: PACU  Anesthesia Type:MAC  Level of Consciousness: awake, alert  and oriented  Airway & Oxygen Therapy: Patient Spontanous Breathing  Post-op Assessment: Report given to RN and Post -op Vital signs reviewed and stable  Post vital signs: Reviewed and stable  Last Vitals:  Vitals Value Taken Time  BP 168/82 1118  Temp 97.7   Pulse 88   Resp 16   SpO2 99     Last Pain:  Vitals:   07/31/17 0915  TempSrc: Tympanic  PainSc: 0-No pain         Complications: No apparent anesthesia complications

## 2017-08-01 ENCOUNTER — Encounter: Payer: Self-pay | Admitting: Ophthalmology

## 2017-08-26 ENCOUNTER — Encounter
Admission: RE | Disposition: A | Discharge status: Home / Self Care | Payer: Self-pay | Source: Ambulatory Visit | Attending: Ophthalmology

## 2017-08-26 ENCOUNTER — Other Ambulatory Visit: Payer: Self-pay

## 2017-08-26 ENCOUNTER — Encounter: Payer: Self-pay | Admitting: *Deleted

## 2017-08-26 ENCOUNTER — Ambulatory Visit
Admission: RE | Admit: 2017-08-26 | Discharge: 2017-08-26 | Disposition: A | Discharge status: Home / Self Care | Payer: Medicare Other | Source: Ambulatory Visit | Attending: Ophthalmology | Admitting: Ophthalmology

## 2017-08-26 ENCOUNTER — Ambulatory Visit: Payer: Medicare Other | Admitting: Anesthesiology

## 2017-08-26 DIAGNOSIS — I509 Heart failure, unspecified: Secondary | ICD-10-CM | POA: Insufficient documentation

## 2017-08-26 DIAGNOSIS — F419 Anxiety disorder, unspecified: Secondary | ICD-10-CM | POA: Diagnosis not present

## 2017-08-26 DIAGNOSIS — Z79899 Other long term (current) drug therapy: Secondary | ICD-10-CM | POA: Diagnosis not present

## 2017-08-26 DIAGNOSIS — Z7901 Long term (current) use of anticoagulants: Secondary | ICD-10-CM | POA: Insufficient documentation

## 2017-08-26 DIAGNOSIS — I4891 Unspecified atrial fibrillation: Secondary | ICD-10-CM | POA: Diagnosis not present

## 2017-08-26 DIAGNOSIS — I110 Hypertensive heart disease with heart failure: Secondary | ICD-10-CM | POA: Insufficient documentation

## 2017-08-26 DIAGNOSIS — F329 Major depressive disorder, single episode, unspecified: Secondary | ICD-10-CM | POA: Diagnosis not present

## 2017-08-26 DIAGNOSIS — Z8673 Personal history of transient ischemic attack (TIA), and cerebral infarction without residual deficits: Secondary | ICD-10-CM | POA: Insufficient documentation

## 2017-08-26 DIAGNOSIS — Z87891 Personal history of nicotine dependence: Secondary | ICD-10-CM | POA: Diagnosis not present

## 2017-08-26 DIAGNOSIS — H2512 Age-related nuclear cataract, left eye: Secondary | ICD-10-CM | POA: Diagnosis present

## 2017-08-26 HISTORY — PX: CATARACT EXTRACTION W/PHACO: SHX586

## 2017-08-26 SURGERY — PHACOEMULSIFICATION, CATARACT, WITH IOL INSERTION
Anesthesia: Monitor Anesthesia Care | Site: Eye | Laterality: Left | Wound class: "Clean "

## 2017-08-26 MED ORDER — NA CHONDROIT SULF-NA HYALURON 40-17 MG/ML IO SOLN
INTRAOCULAR | Status: AC
  Filled 2017-08-26: qty 1

## 2017-08-26 MED ORDER — MOXIFLOXACIN HCL 0.5 % OP SOLN
OPHTHALMIC | Status: AC
  Filled 2017-08-26: qty 3

## 2017-08-26 MED ORDER — METOPROLOL TARTRATE 5 MG/5ML IV SOLN
INTRAVENOUS | Status: DC | PRN
  Administered 2017-08-26: 1 mg via INTRAVENOUS
  Administered 2017-08-26 (×2): 2 mg via INTRAVENOUS

## 2017-08-26 MED ORDER — LIDOCAINE HCL (PF) 4 % IJ SOLN
INTRAOCULAR | Status: DC | PRN
  Administered 2017-08-26: 2 mL via OPHTHALMIC

## 2017-08-26 MED ORDER — PHENYLEPHRINE HCL 10 % OP SOLN
OPHTHALMIC | Status: AC
  Administered 2017-08-26: 1 [drp] via OPHTHALMIC
  Filled 2017-08-26: qty 5

## 2017-08-26 MED ORDER — PHENYLEPHRINE HCL 10 % OP SOLN
1.0000 [drp] | OPHTHALMIC | Status: DC | PRN
  Administered 2017-08-26 (×4): 1 [drp] via OPHTHALMIC

## 2017-08-26 MED ORDER — POVIDONE-IODINE 5 % OP SOLN
OPHTHALMIC | Status: AC
  Filled 2017-08-26: qty 30

## 2017-08-26 MED ORDER — CYCLOPENTOLATE HCL 2 % OP SOLN
OPHTHALMIC | Status: AC
  Administered 2017-08-26: 1 [drp] via OPHTHALMIC
  Filled 2017-08-26: qty 2

## 2017-08-26 MED ORDER — EPINEPHRINE PF 1 MG/ML IJ SOLN
INTRAMUSCULAR | Status: AC
  Filled 2017-08-26: qty 1

## 2017-08-26 MED ORDER — MIDAZOLAM HCL 2 MG/2ML IJ SOLN
INTRAMUSCULAR | Status: DC | PRN
  Administered 2017-08-26: 0.5 mg via INTRAVENOUS
  Administered 2017-08-26: 1 mg via INTRAVENOUS
  Administered 2017-08-26: .5 mg via INTRAVENOUS

## 2017-08-26 MED ORDER — CYCLOPENTOLATE HCL 2 % OP SOLN
1.0000 [drp] | OPHTHALMIC | Status: DC | PRN
  Administered 2017-08-26 (×4): 1 [drp] via OPHTHALMIC

## 2017-08-26 MED ORDER — TETRACAINE HCL 0.5 % OP SOLN
1.0000 [drp] | Freq: Once | OPHTHALMIC | Status: AC
  Administered 2017-08-26: 1 [drp] via OPHTHALMIC

## 2017-08-26 MED ORDER — CARBACHOL 0.01 % IO SOLN
INTRAOCULAR | Status: DC | PRN
  Administered 2017-08-26: .5 mL via INTRAOCULAR

## 2017-08-26 MED ORDER — MOXIFLOXACIN HCL 0.5 % OP SOLN: 1.0000 [drp] | OPHTHALMIC | Status: DC | PRN

## 2017-08-26 MED ORDER — ONDANSETRON HCL 4 MG/2ML IJ SOLN: 4.0000 mg | Freq: Once | INTRAMUSCULAR | Status: DC | PRN

## 2017-08-26 MED ORDER — TETRACAINE HCL 0.5 % OP SOLN
OPHTHALMIC | Status: AC
  Administered 2017-08-26: 1 [drp] via OPHTHALMIC
  Filled 2017-08-26: qty 4

## 2017-08-26 MED ORDER — MOXIFLOXACIN HCL 0.5 % OP SOLN
OPHTHALMIC | Status: DC | PRN
  Administered 2017-08-26: .2 mL via OPHTHALMIC

## 2017-08-26 MED ORDER — FENTANYL CITRATE (PF) 100 MCG/2ML IJ SOLN: 25.0000 ug | INTRAMUSCULAR | Status: DC | PRN

## 2017-08-26 MED ORDER — LIDOCAINE HCL (PF) 4 % IJ SOLN
INTRAMUSCULAR | Status: AC
  Filled 2017-08-26: qty 5

## 2017-08-26 MED ORDER — EPINEPHRINE PF 1 MG/ML IJ SOLN
INTRAOCULAR | Status: DC | PRN
  Administered 2017-08-26: 1 mL via OPHTHALMIC

## 2017-08-26 MED ORDER — POVIDONE-IODINE 5 % OP SOLN
OPHTHALMIC | Status: DC | PRN
  Administered 2017-08-26: 1 via OPHTHALMIC

## 2017-08-26 MED ORDER — SODIUM CHLORIDE 0.9 % IV SOLN
INTRAVENOUS | Status: DC
  Administered 2017-08-26: 09:00:00 via INTRAVENOUS

## 2017-08-26 MED ORDER — NA CHONDROIT SULF-NA HYALURON 40-17 MG/ML IO SOLN
INTRAOCULAR | Status: DC | PRN
  Administered 2017-08-26: 1 mL via INTRAOCULAR

## 2017-08-26 MED ORDER — MIDAZOLAM HCL 2 MG/2ML IJ SOLN
INTRAMUSCULAR | Status: AC
  Filled 2017-08-26: qty 2

## 2017-08-26 SURGICAL SUPPLY — 16 items
GLOVE BIO SURGEON STRL SZ8 (GLOVE) ×2 IMPLANT
GLOVE BIOGEL M 6.5 STRL (GLOVE) ×2 IMPLANT
GLOVE SURG LX 8.0 MICRO (GLOVE) ×1
GLOVE SURG LX STRL 8.0 MICRO (GLOVE) ×1 IMPLANT
GOWN STRL REUS W/ TWL LRG LVL3 (GOWN DISPOSABLE) ×2 IMPLANT
GOWN STRL REUS W/TWL LRG LVL3 (GOWN DISPOSABLE) ×2
LABEL CATARACT MEDS ST (LABEL) ×2 IMPLANT
LENS IOL TECNIS ITEC 19.0 (Intraocular Lens) ×1 IMPLANT
PACK CATARACT (MISCELLANEOUS) ×2 IMPLANT
PACK CATARACT BRASINGTON LX (MISCELLANEOUS) ×2 IMPLANT
PACK EYE AFTER SURG (MISCELLANEOUS) ×2 IMPLANT
SOL BSS BAG (MISCELLANEOUS) ×2
SOLUTION BSS BAG (MISCELLANEOUS) ×1 IMPLANT
SYR 5ML LL (SYRINGE) ×2 IMPLANT
WATER STERILE IRR 250ML POUR (IV SOLUTION) ×2 IMPLANT
WIPE NON LINTING 3.25X3.25 (MISCELLANEOUS) ×2 IMPLANT

## 2017-08-26 NOTE — Op Note (Signed)
PREOPERATIVE DIAGNOSIS:  Nuclear sclerotic cataract of the left eye.   POSTOPERATIVE DIAGNOSIS:  Nuclear sclerotic cataract of the left eye.   OPERATIVE PROCEDURE: Procedure(s): CATARACT EXTRACTION PHACO AND INTRAOCULAR LENS PLACEMENT (IOC)   SURGEON:  Birder Robson, MD.   ANESTHESIA:  Anesthesiologist: Molli Barrows, MD CRNA: Nelda Marseille, CRNA  1.      Managed anesthesia care. 2.     0.55ml of Shugarcaine was instilled following the paracentesis   COMPLICATIONS:  None.   TECHNIQUE:   Stop and chop   DESCRIPTION OF PROCEDURE:  The patient was examined and consented in the preoperative holding area where the aforementioned topical anesthesia was applied to the left eye and then brought back to the Operating Room where the left eye was prepped and draped in the usual sterile ophthalmic fashion and a lid speculum was placed. A paracentesis was created with the side port blade and the anterior chamber was filled with viscoelastic. A near clear corneal incision was performed with the steel keratome. A continuous curvilinear capsulorrhexis was performed with a cystotome followed by the capsulorrhexis forceps. Hydrodissection and hydrodelineation were carried out with BSS on a blunt cannula. The lens was removed in a stop and chop  technique and the remaining cortical material was removed with the irrigation-aspiration handpiece. The capsular bag was inflated with viscoelastic and the Technis ZCB00 lens was placed in the capsular bag without complication. The remaining viscoelastic was removed from the eye with the irrigation-aspiration handpiece. The wounds were hydrated. The anterior chamber was flushed with Miostat and the eye was inflated to physiologic pressure. 0.87ml Vigamox was placed in the anterior chamber. The wounds were found to be water tight. The eye was dressed with Vigamox. The patient was given protective glasses to wear throughout the day and a shield with which to sleep tonight.  The patient was also given drops with which to begin a drop regimen today and will follow-up with me in one day. Implant Name Type Inv. Item Serial No. Manufacturer Lot No. LRB No. Used  LENS IOL DIOP 19.0 - [id number] Intraocular Lens LENS IOL DIOP 19.0 [id number]  Left 1    Procedure(s) with comments: CATARACT EXTRACTION PHACO AND INTRAOCULAR LENS PLACEMENT (IOC) (Left) - [place] 00:38 AP% 15.3 CDE 5.95 Fluid pack lot # [id number]  Electronically signed: Birder Robson 08/26/2017 9:35 AM

## 2017-08-26 NOTE — Discharge Instructions (Addendum)
Eye Surgery Discharge Instructions  Follow Dr. Inda Coke eye drop instruction sheet as reviewed.  Expect mild scratchy sensation or mild soreness. DO NOT RUB YOUR EYE!  The day of surgery:  Minimal physical activity, but bed rest is not required  No reading, computer work, or close hand work  No bending, lifting, or straining.  May watch TV  For 24 hours:  No driving, legal decisions, or alcoholic beverages  Safety precautions  Eat anything you prefer: It is better to start with liquids, then soup then solid foods.  Solar shield eyeglasses should be worn for comfort in the sunlight/patch while sleeping  Resume all regular medications including aspirin or Coumadin if these were discontinued prior to surgery. You may shower, bathe, shave, or wash your hair. Tylenol may be taken for mild discomfort.  Call your doctor if you experience significant pain, nausea, or vomiting, fever > 101 or other signs of infection. [phone] or [phone] Specific instructions:  Follow-up Information    Birder Robson, MD Follow up.   Specialty:  Ophthalmology Why:  July 17 at 10:40am Contact information: [address] [place] [address] [phone]

## 2017-08-26 NOTE — Transfer of Care (Signed)
Immediate Anesthesia Transfer of Care Note  Patient: Theresa Carrillo  Procedure(s) Performed: CATARACT EXTRACTION PHACO AND INTRAOCULAR LENS PLACEMENT ([facility]) (Left Eye)  Patient Location: PACU  Anesthesia Type:MAC  Level of Consciousness: awake, alert  and oriented  Airway & Oxygen Therapy: Patient Spontanous Breathing  Post-op Assessment: Report given to RN and Post -op Vital signs reviewed and stable  Post vital signs: Reviewed and stable  Last Vitals:  Vitals Value Taken Time  BP    Temp    Pulse    Resp    SpO2      Last Pain:  Vitals:   08/26/17 0811  TempSrc: Tympanic  PainSc: 0-No pain         Complications: No apparent anesthesia complications

## 2017-08-26 NOTE — Anesthesia Post-op Follow-up Note (Signed)
Anesthesia QCDR form completed.        

## 2017-08-26 NOTE — H&P (Signed)
All labs reviewed. Abnormal studies sent to patients PCP when indicated.  Previous H&P reviewed, patient examined, there are NO CHANGES.  Theresa Carrillo Porfilio7/16/20199:09 AM

## 2017-08-26 NOTE — Anesthesia Preprocedure Evaluation (Signed)
Anesthesia Evaluation  Patient identified by MRN, date of birth, ID band Patient awake    Reviewed: Allergy & Precautions, H&P , NPO status , Patient's Chart, lab work & pertinent test results, reviewed documented beta blocker date and time   Airway Mallampati: II  TM Distance: >3 FB Neck ROM: full    Dental no notable dental hx. (+) Teeth Intact   Pulmonary neg pulmonary ROS, former smoker,    Pulmonary exam normal breath sounds clear to auscultation       Cardiovascular Exercise Tolerance: Good hypertension, +CHF  negative cardio ROS  + dysrhythmias  Rhythm:regular Rate:Normal     Neuro/Psych PSYCHIATRIC DISORDERS Anxiety Depression CVA negative neurological ROS  negative psych ROS   GI/Hepatic negative GI ROS, Neg liver ROS,   Endo/Other  negative endocrine ROSdiabetes  Renal/GU Renal disease     Musculoskeletal   Abdominal   Peds  Hematology negative hematology ROS (+)   Anesthesia Other Findings   Reproductive/Obstetrics negative OB ROS                             Anesthesia Physical Anesthesia Plan  ASA: III  Anesthesia Plan: MAC   Post-op Pain Management:    Induction:   PONV Risk Score and Plan: 2  Airway Management Planned:   Additional Equipment:   Intra-op Plan:   Post-operative Plan:   Informed Consent: I have reviewed the patients History and Physical, chart, labs and discussed the procedure including the risks, benefits and alternatives for the proposed anesthesia with the patient or authorized representative who has indicated his/her understanding and acceptance.     Plan Discussed with: CRNA  Anesthesia Plan Comments:         Anesthesia Quick Evaluation

## 2017-08-27 NOTE — Anesthesia Postprocedure Evaluation (Signed)
Anesthesia Post Note  Patient: MELAINE MCPHEE  Procedure(s) Performed: CATARACT EXTRACTION PHACO AND INTRAOCULAR LENS PLACEMENT ([place]) (Left Eye)  Patient location during evaluation: PACU Anesthesia Type: MAC Level of consciousness: awake and alert Pain management: pain level controlled Vital Signs Assessment: post-procedure vital signs reviewed and stable Respiratory status: spontaneous breathing, nonlabored ventilation, respiratory function stable and patient connected to nasal cannula oxygen Cardiovascular status: blood pressure returned to baseline and stable Postop Assessment: no apparent nausea or vomiting Anesthetic complications: no     Last Vitals:  Vitals:   08/26/17 0811 08/26/17 0937  BP: (!) 110/91 130/73  Pulse: 75 86  Resp: 18 18  Temp: (!) 35.7 C (!) 36.3 C  SpO2: 100% 100%    Last Pain:  Vitals:   08/26/17 0937  TempSrc: Temporal  PainSc: 0-No pain                 Molli Barrows

## 2017-09-11 ENCOUNTER — Other Ambulatory Visit: Payer: Self-pay | Admitting: Student

## 2017-09-11 DIAGNOSIS — M5441 Lumbago with sciatica, right side: Principal | ICD-10-CM

## 2017-09-11 DIAGNOSIS — M5442 Lumbago with sciatica, left side: Principal | ICD-10-CM

## 2017-09-11 DIAGNOSIS — G8929 Other chronic pain: Secondary | ICD-10-CM

## 2017-09-21 ENCOUNTER — Ambulatory Visit
Admission: RE | Admit: 2017-09-21 | Discharge: 2017-09-21 | Disposition: A | Discharge status: Home / Self Care | Payer: Medicare Other | Source: Ambulatory Visit | Attending: Student | Admitting: Student

## 2017-09-21 DIAGNOSIS — M48061 Spinal stenosis, lumbar region without neurogenic claudication: Secondary | ICD-10-CM | POA: Diagnosis not present

## 2017-09-21 DIAGNOSIS — S32028A Other fracture of second lumbar vertebra, initial encounter for closed fracture: Secondary | ICD-10-CM | POA: Insufficient documentation

## 2017-09-21 DIAGNOSIS — M5441 Lumbago with sciatica, right side: Secondary | ICD-10-CM | POA: Diagnosis present

## 2017-09-21 DIAGNOSIS — X58XXXA Exposure to other specified factors, initial encounter: Secondary | ICD-10-CM | POA: Insufficient documentation

## 2017-09-21 DIAGNOSIS — M5126 Other intervertebral disc displacement, lumbar region: Secondary | ICD-10-CM | POA: Insufficient documentation

## 2017-09-21 DIAGNOSIS — G8929 Other chronic pain: Secondary | ICD-10-CM | POA: Insufficient documentation

## 2017-09-21 DIAGNOSIS — M5442 Lumbago with sciatica, left side: Secondary | ICD-10-CM | POA: Insufficient documentation

## 2017-11-04 ENCOUNTER — Ambulatory Visit: Payer: Medicare Other | Admitting: Student in an Organized Health Care Education/Training Program

## 2017-11-11 ENCOUNTER — Other Ambulatory Visit: Payer: Self-pay

## 2017-11-11 ENCOUNTER — Ambulatory Visit
Payer: Medicare Other | Attending: Student in an Organized Health Care Education/Training Program | Admitting: Student in an Organized Health Care Education/Training Program

## 2017-11-11 ENCOUNTER — Telehealth: Payer: Self-pay

## 2017-11-11 ENCOUNTER — Encounter: Payer: Self-pay | Admitting: Student in an Organized Health Care Education/Training Program

## 2017-11-11 VITALS — BP 140/80 | HR 80 | Temp 97.7°F | Resp 16 | Ht 60.0 in | Wt 145.0 lb

## 2017-11-11 DIAGNOSIS — Z7901 Long term (current) use of anticoagulants: Secondary | ICD-10-CM | POA: Diagnosis not present

## 2017-11-11 DIAGNOSIS — M47816 Spondylosis without myelopathy or radiculopathy, lumbar region: Secondary | ICD-10-CM

## 2017-11-11 DIAGNOSIS — M5442 Lumbago with sciatica, left side: Secondary | ICD-10-CM

## 2017-11-11 DIAGNOSIS — M48061 Spinal stenosis, lumbar region without neurogenic claudication: Secondary | ICD-10-CM | POA: Diagnosis not present

## 2017-11-11 DIAGNOSIS — I110 Hypertensive heart disease with heart failure: Secondary | ICD-10-CM | POA: Insufficient documentation

## 2017-11-11 DIAGNOSIS — G894 Chronic pain syndrome: Secondary | ICD-10-CM | POA: Insufficient documentation

## 2017-11-11 DIAGNOSIS — M129 Arthropathy, unspecified: Secondary | ICD-10-CM | POA: Diagnosis not present

## 2017-11-11 DIAGNOSIS — I480 Paroxysmal atrial fibrillation: Secondary | ICD-10-CM | POA: Diagnosis not present

## 2017-11-11 DIAGNOSIS — F419 Anxiety disorder, unspecified: Secondary | ICD-10-CM | POA: Insufficient documentation

## 2017-11-11 DIAGNOSIS — I5021 Acute systolic (congestive) heart failure: Secondary | ICD-10-CM | POA: Insufficient documentation

## 2017-11-11 DIAGNOSIS — G8929 Other chronic pain: Secondary | ICD-10-CM

## 2017-11-11 DIAGNOSIS — Z79899 Other long term (current) drug therapy: Secondary | ICD-10-CM | POA: Insufficient documentation

## 2017-11-11 DIAGNOSIS — M5126 Other intervertebral disc displacement, lumbar region: Secondary | ICD-10-CM | POA: Diagnosis not present

## 2017-11-11 DIAGNOSIS — M5441 Lumbago with sciatica, right side: Secondary | ICD-10-CM

## 2017-11-11 MED ORDER — METHYLPREDNISOLONE 4 MG PO TBPK: ORAL_TABLET | ORAL | 0 refills | Status: AC

## 2017-11-11 NOTE — Telephone Encounter (Signed)
Spoke with Dr Holley Raring.  Instructed patient to not start the Steroid pack until her abcess is better and she no longer needs antibiotics.

## 2017-11-11 NOTE — Progress Notes (Signed)
Patient's Name: Theresa Carrillo  MRN: [id number]  Referring Provider: Glendon Axe, MD  DOB: [birth year]  PCP: Glendon Axe, MD  DOS: 11/11/2017  Note by: Gillis Santa, MD  Service setting: Ambulatory outpatient  Specialty: Interventional Pain Management  Location: [facility] (AMB) Pain Management Facility  Visit type: Initial Patient Evaluation  Patient type: New Patient   Primary Reason(s) for Visit: Encounter for initial evaluation of one or more chronic problems (new to examiner) potentially causing chronic pain, and posing a threat to normal musculoskeletal function. (Level of risk: High) CC: Back Pain (right, lower) and Leg Pain (both, entire legs)  HPI  Theresa Carrillo is a 84 y.o. year old, female patient, who comes today to see [place] for the first time for an initial evaluation of her chronic pain. She has Acute systolic CHF (congestive heart failure) ([place]); AF (paroxysmal atrial fibrillation) ([place]); Anxiety; HTN (hypertension); HLD (hyperlipidemia); Chronic diastolic heart failure ([place]); Chronic bilateral low back pain with bilateral sciatica; Lumbar facet arthropathy; Foraminal stenosis of lumbar region; Lumbar disc herniation; and Chronic pain syndrome on their problem list. Today she comes in for evaluation of her Back Pain (right, lower) and Leg Pain (both, entire legs)  Pain Assessment: Location: Right, Left Leg Radiating: left Onset: More than a month ago Duration: Chronic pain Quality: Pressure, Pounding Severity: 5 /10 (subjective, self-reported pain score)  Note: Reported level is compatible with observation.                         When using our objective Pain Scale, levels between 6 and 10/10 are said to belong in an emergency room, as it progressively worsens from a 6/10, described as severely limiting, requiring emergency care not usually available at an outpatient pain management facility. At a 6/10 level, communication becomes difficult and requires great effort. Assistance to  reach the emergency department may be required. Facial flushing and profuse sweating along with potentially dangerous increases in heart rate and blood pressure will be evident. Effect on ADL:   Timing: Constant Modifying factors: rest BP: 140/80  HR: 80  Onset and Duration: Sudden and Present longer than 3 months Cause of pain: Unknown Severity: No change since onset, NAS-11 at its best: 3/10, NAS-11 now: 7/10 and NAS-11 on the average: 7/10 Timing: Not influenced by the time of the day and After activity or exercise Aggravating Factors: Motion, Prolonged standing, Squatting, Stooping  and Walking Alleviating Factors: Lying down and Sitting Associated Problems: Constipation, Depression and Fatigue Quality of Pain: Aching, Disabling and Sharp Previous Examinations or Tests: MRI scan Previous Treatments: Traction  The patient comes into the clinics today for the first time for a chronic pain management evaluation.   Very pleasant 84 year old female who presents with a chief complaint of axial low back pain that radiates down bilateral legs secondary to lumbar degenerative disc disease, multilevel disc herniations, bilateral facet arthropathy, moderate spinal stenosis and bilateral lateral recess stenosis most prominent at L2-L3 and L3-L4 along with chronic L2 vertebral body compression fracture.  Patient has had multiple epidural steroid injections in the past which were not effective.  Of note patient has a significant cardiac history as detailed below is currently anticoagulated with warfarin.  She is high risk to be off this medication as a result do not think that she will be a candidate for any neuraxial interventional therapies especially in the context of prior interventions that were not effective.  Of note patient has tried prednisone taper  in the past which was not effective.  She states that her friend in [place] who has similar pain tried Depo-Medrol which was effective and she is  interested in trialing this.  She is currently not on any opioid analgesics.  She wants to stay away from these medications.  Patient does take gabapentin on 100 mg 3 times daily.  Today I took the time to provide the patient with information regarding my pain practice. The patient was informed that my practice is divided into two sections: an interventional pain management section, as well as a completely separate and distinct medication management section. I explained that I have procedure days for my interventional therapies, and evaluation days for follow-ups and medication management. Because of the amount of documentation required during both, they are kept separated. This means that there is the possibility that she may be scheduled for a procedure on one day, and medication management the next. I have also informed her that because of staffing and facility limitations, I no longer take patients for medication management only. To illustrate the reasons for this, I gave the patient the example of surgeons, and how inappropriate it would be to refer a patient to his/her care, just to write for the post-surgical antibiotics on a surgery done by a different surgeon.   Because interventional pain management is my board-certified specialty, the patient was informed that joining my practice means that they are open to any and all interventional therapies. I made it clear that this does not mean that they will be forced to have any procedures done. What this means is that I believe interventional therapies to be essential part of the diagnosis and proper management of chronic pain conditions. Therefore, patients not interested in these interventional alternatives will be better served under the care of a different practitioner.  The patient was also made aware of my Comprehensive Pain Management Safety Guidelines where by joining my practice, they limit all of their nerve blocks and joint injections to those  done by our practice, for as long as we are retained to manage their care.   Historic Controlled Substance Pharmacotherapy Review  Historical Background Evaluation: [place] PMP: Six (6) year initial data search conducted.               [place] Department of public safety, offender search: Editor, commissioning Information) Non-contributory Risk Assessment Profile: Aberrant behavior: None observed or detected today Risk factors for fatal opioid overdose: None identified today Fatal overdose hazard ratio (HR): Calculation deferred Non-fatal overdose hazard ratio (HR): Calculation deferred Risk of opioid abuse or dependence: 0.7-3.0% with doses ? 36 MME/day and 6.1-26% with doses ? 120 MME/day. Substance use disorder (SUD) risk level: See below Personal History of Substance Abuse (SUD-Substance use disorder):  Alcohol: Negative  Illegal Drugs: Negative  Rx Drugs: Negative  ORT Risk Level calculation: Low Risk Opioid Risk Tool - 11/11/17 0855      Family History of Substance Abuse   Alcohol  Negative    Illegal Drugs  Negative    Rx Drugs  Negative      Personal History of Substance Abuse   Alcohol  Negative    Illegal Drugs  Negative    Rx Drugs  Negative      Age   Age between 46-45 years   No      Psychological Disease   Psychological Disease  Negative    Depression  Positive      Total Score   Opioid Risk Tool Scoring  1    Opioid Risk Interpretation  Low Risk      ORT Scoring interpretation table:  Score <3 = Low Risk for SUD  Score between 4-7 = Moderate Risk for SUD  Score >8 = High Risk for Opioid Abuse   PHQ-2 Depression Scale:  Total score: 0  PHQ-2 Scoring interpretation table: (Score and probability of major depressive disorder)  Score 0 = No depression  Score 1 = 15.4% Probability  Score 2 = 21.1% Probability  Score 3 = 38.4% Probability  Score 4 = 45.5% Probability  Score 5 = 56.4% Probability  Score 6 = 78.6% Probability   PHQ-9 Depression Scale:  Total score: 0  PHQ-9  Scoring interpretation table:  Score 0-4 = No depression  Score 5-9 = Mild depression  Score 10-14 = Moderate depression  Score 15-19 = Moderately severe depression  Score 20-27 = Severe depression (2.4 times higher risk of SUD and 2.89 times higher risk of overuse)   Pharmacologic Plan: Ms. Tavano indicated having a preference to stay away from opioid analgesics.            Initial impression: The patient indicated having no interest, at this time.  Meds   Current Outpatient Medications:  .  Ascorbic Acid (VITAMIN C) 1000 MG tablet, Take 1,000 mg by mouth daily., Disp: , Rfl:  .  calcium carbonate (OS-CAL - DOSED IN MG OF ELEMENTAL CALCIUM) 1250 (500 Ca) MG tablet, Take 500 mg by mouth daily., Disp: , Rfl:  .  Cholecalciferol (VITAMIN D3) 1000 units CAPS, Take 1,000 Units by mouth daily., Disp: , Rfl:  .  doxepin (SINEQUAN) 10 MG capsule, Take 10 mg by mouth 4 (four) times daily. , Disp: , Rfl:  .  furosemide (LASIX) 20 MG tablet, Take 20 mg by mouth daily., Disp: , Rfl:  .  losartan (COZAAR) 25 MG tablet, Take 25 mg by mouth daily. , Disp: , Rfl:  .  metoprolol tartrate (LOPRESSOR) 25 MG tablet, Take 1 tablet (25 mg total) by mouth 2 (two) times daily., Disp: 60 tablet, Rfl: 0 .  Multiple Vitamin (MULTI-VITAMINS) TABS, Take 1 tablet by mouth daily., Disp: , Rfl:  .  Omega-3 1000 MG CAPS, Take 1,000 g by mouth daily., Disp: , Rfl:  .  sotalol (BETAPACE) 80 MG tablet, Take 1 tablet (80 mg total) by mouth daily. (Patient taking differently: Take 80 mg by mouth 2 (two) times daily. ), Disp: 30 tablet, Rfl: 0 .  triamcinolone cream (KENALOG) 0.1 %, Apply 1 application topically daily as needed (skin irritation)., Disp: , Rfl:  .  warfarin (COUMADIN) 1 MG tablet, Take 3 mg by mouth 2 (two) times a week. Tuesday, Saturday and Sunday, Disp: , Rfl:  .  warfarin (COUMADIN) 4 MG tablet, Take 4 mg by mouth. Monday, Wednesday, Thursday and Friday, Disp: , Rfl:  .  albuterol (PROVENTIL HFA;VENTOLIN  HFA) 108 (90 Base) MCG/ACT inhaler, Inhale 2 puffs into the lungs every 6 (six) hours as needed for wheezing or shortness of breath. (Patient not taking: Reported on 07/25/2017), Disp: 1 Inhaler, Rfl: 2 .  methylPREDNISolone (MEDROL) 4 MG TBPK tablet, Follow package instructions., Disp: 21 tablet, Rfl: 0 .  temazepam (RESTORIL) 15 MG capsule, Take 15 mg by mouth at bedtime., Disp: , Rfl:   Imaging Review  Cervical Imaging:  Cervical CT wo contrast:  Results for orders placed during the hospital encounter of 06/26/16  CT Cervical Spine Wo Contrast   Narrative CLINICAL DATA:  Fall,  neck pain  EXAM: CT HEAD WITHOUT CONTRAST  CT CERVICAL SPINE WITHOUT CONTRAST  TECHNIQUE: Multidetector CT imaging of the head and cervical spine was performed following the standard protocol without intravenous contrast. Multiplanar CT image reconstructions of the cervical spine were also generated.  COMPARISON:  None.  FINDINGS: CT HEAD FINDINGS  Brain: No acute territorial infarction, hemorrhage or intracranial mass. Moderate-to-marked global atrophy. Prominent ventricles felt secondary to atrophy. Mild small vessel ischemic changes of the periventricular white matter. Old appearing lacunar infarct in the left basal ganglia.  Vascular: No hyperdense vessels.  Carotid artery calcifications.  Skull:  No fracture or suspicious bone lesion.  Sinuses/Orbits: No acute finding.  Other: None  CT CERVICAL SPINE FINDINGS  Alignment: Reversal of cervical lordosis. Trace retrolisthesis of C4 on C5. Trace anterolisthesis of C3 on C4.  Skull base and vertebrae: Craniovertebral junction is intact. Vertebral body heights are maintained. No fracture is seen.  Soft tissues and spinal canal: No prevertebral fluid or swelling. No visible canal hematoma.  Disc levels: Multilevel degenerative disc changes, moderate severe at C3-C4, C4-C5 and C5-C6. Large posterior disc osteophyte complex at C5-C6 and on  the right side at C4-C5. Multilevel bilateral facet arthropathy. Severe bilateral foraminal stenosis at C5-C6 and on the right side at C4-C5.  Upper chest: Negative.  Other: Carotid artery calcifications.  IMPRESSION: 1. No definite CT evidence for acute intracranial abnormality. 2. Reversal of cervical lordosis. No definite acute osseous abnormality.   Electronically Signed   By: Carrillo Foil M.D.   On: 06/26/2016 21:44    Lumbar MR wo contrast:  Results for orders placed during the hospital encounter of 09/21/17  MR LUMBAR SPINE WO CONTRAST   Narrative CLINICAL DATA:  Low back pain, bilateral leg pain, no known injury  EXAM: MRI LUMBAR SPINE WITHOUT CONTRAST  TECHNIQUE: Multiplanar, multisequence MR imaging of the lumbar spine was performed. No intravenous contrast was administered.  COMPARISON:  None.  FINDINGS: Segmentation:  Standard.  Alignment: 3 mm retrolisthesis of L3 on L4. minimal grade 1 anterolisthesis of L5 on S1 secondary to facet disease.  Vertebrae: Chronic L2 vertebral body fracture with 8 mm retropulsion of the posterior inferior margin of the L2 vertebral body impressing on the ventral thecal sac. Remainder the vertebral body heights are maintained.  Conus medullaris and cauda equina: Conus extends to the L1 level. Conus and cauda equina appear normal.  Paraspinal and other soft tissues: No acute paraspinal abnormality. Bilateral small renal cysts.  Disc levels:  Disc spaces: Degenerative disc disease with disc desiccation throughout the lumbar spine.  T11-12: Mild broad-based disc bulge. No foraminal or central canal stenosis.  T12-L1: Mild broad-based disc bulge. No evidence of neural foraminal stenosis. No central canal stenosis.  L1-L2: Mild broad-based disc bulge. Mild bilateral facet arthropathy. Mild left foraminal stenosis. No right foraminal stenosis. No evidence of neural foraminal stenosis. No central  canal stenosis.  L2-L3: 8 mm retropulsion of the posterior inferior margin of the L2 vertebral body impressing on the ventral thecal sac. Superimposed broad-based disc bulge. Mild bilateral facet arthropathy. Moderate spinal stenosis and bilateral lateral recess stenosis. Moderate bilateral foraminal stenosis.  L3-L4: Broad-based disc bulge. Moderate bilateral facet arthropathy, right worse than left. Right lateral recess stenosis. No left foraminal stenosis. Moderate right foraminal stenosis. No central canal stenosis.  L4-L5: Broad-based disc bulge. Moderate right and mild left facet arthropathy. No evidence of neural foraminal stenosis. No central canal stenosis.  L5-S1: Broad-based disc bulge. Severe bilateral facet arthropathy. No  evidence of neural foraminal stenosis. No central canal stenosis.  IMPRESSION: 1. At L2-3 there is 8 mm retropulsion of the posterior inferior margin of the L2 vertebral body impressing on the ventral thecal sac. Superimposed broad-based disc bulge. Mild bilateral facet arthropathy. Moderate spinal stenosis and bilateral lateral recess stenosis. Moderate bilateral foraminal stenosis. 2. At L3-4 there is broad-based disc bulge. Moderate bilateral facet arthropathy, right worse than left. Right lateral recess stenosis. Moderate right foraminal stenosis. 3. Chronic L2 vertebral body fracture with 8 mm retropulsion of the posterior inferior margin of the L2 vertebral body impressing on the ventral thecal sac.   Electronically Signed   By: Kathreen Devoid   On: 09/21/2017 12:12      Complexity Note: Imaging results reviewed. Results shared with Ms. Dubose, using [employer] terms.                   I have personally examined the images and I agree with the reported  findings. I find no additional pain-related pathology to add to the report.  ROS  Cardiovascular: Heart trouble, Abnormal heart rhythm, Weak heart (CHF) and Blood thinners:   Anticoagulant Pulmonary or Respiratory: No reported pulmonary signs or symptoms such as wheezing and difficulty taking a deep full breath (Asthma), difficulty blowing air out (Emphysema), coughing up mucus (Bronchitis), persistent dry cough, or temporary stoppage of breathing during sleep Neurological: No reported neurological signs or symptoms such as seizures, abnormal skin sensations, urinary and/or fecal incontinence, being born with an abnormal open spine and/or a tethered spinal cord Review of Past Neurological Studies:  Results for orders placed or performed during the hospital encounter of 06/26/16  CT Head Wo Contrast   Narrative   CLINICAL DATA:  Fall, neck pain  EXAM: CT HEAD WITHOUT CONTRAST  CT CERVICAL SPINE WITHOUT CONTRAST  TECHNIQUE: Multidetector CT imaging of the head and cervical spine was performed following the standard protocol without intravenous contrast. Multiplanar CT image reconstructions of the cervical spine were also generated.  COMPARISON:  None.  FINDINGS: CT HEAD FINDINGS  Brain: No acute territorial infarction, hemorrhage or intracranial mass. Moderate-to-marked global atrophy. Prominent ventricles felt secondary to atrophy. Mild small vessel ischemic changes of the periventricular white matter. Old appearing lacunar infarct in the left basal ganglia.  Vascular: No hyperdense vessels.  Carotid artery calcifications.  Skull:  No fracture or suspicious bone lesion.  Sinuses/Orbits: No acute finding.  Other: None  CT CERVICAL SPINE FINDINGS  Alignment: Reversal of cervical lordosis. Trace retrolisthesis of C4 on C5. Trace anterolisthesis of C3 on C4.  Skull base and vertebrae: Craniovertebral junction is intact. Vertebral body heights are maintained. No fracture is seen.  Soft tissues and spinal canal: No prevertebral fluid or swelling. No visible canal hematoma.  Disc levels: Multilevel degenerative disc changes, moderate severe at  C3-C4, C4-C5 and C5-C6. Large posterior disc osteophyte complex at C5-C6 and on the right side at C4-C5. Multilevel bilateral facet arthropathy. Severe bilateral foraminal stenosis at C5-C6 and on the right side at C4-C5.  Upper chest: Negative.  Other: Carotid artery calcifications.  IMPRESSION: 1. No definite CT evidence for acute intracranial abnormality. 2. Reversal of cervical lordosis. No definite acute osseous abnormality.   Electronically Signed   By: Carrillo Foil M.D.   On: 06/26/2016 21:44    Psychological-Psychiatric: Anxiousness and Depressed Gastrointestinal: No reported gastrointestinal signs or symptoms such as vomiting or evacuating blood, reflux, heartburn, alternating episodes of diarrhea and constipation, inflamed or scarred liver, or pancreas or  irrregular and/or infrequent bowel movements Genitourinary: No reported renal or genitourinary signs or symptoms such as difficulty voiding or producing urine, peeing blood, non-functioning kidney, kidney stones, difficulty emptying the bladder, difficulty controlling the flow of urine, or chronic kidney disease Hematological: No reported hematological signs or symptoms such as prolonged bleeding, low or poor functioning platelets, bruising or bleeding easily, hereditary bleeding problems, low energy levels due to low hemoglobin or being anemic Endocrine: No reported endocrine signs or symptoms such as high or low blood sugar, rapid heart rate due to high thyroid levels, obesity or weight gain due to slow thyroid or thyroid disease Rheumatologic: No reported rheumatological signs and symptoms such as fatigue, joint pain, tenderness, swelling, redness, heat, stiffness, decreased range of motion, with or without associated rash   Musculoskeletal: Negative for myasthenia gravis, muscular dystrophy, multiple sclerosis or malignant hyperthermia Work History: Retired  Allergies  Ms. Bourque has No Known Allergies.  Laboratory  Chemistry  Inflammation Markers (CRP: Acute Phase) (ESR: Chronic Phase) Lab Results  Component Value Date   LATICACIDVEN 1.0 06/26/2016                         Rheumatology Markers No results found for: RF, ANA, LABURIC, URICUR, LYMEIGGIGMAB, LYMEABIGMQN, HLAB27                      Renal Function Markers Lab Results  Component Value Date   BUN 26 (H) 03/18/2017   CREATININE 1.12 (H) 03/18/2017   GFRAA 50 (L) 03/18/2017   GFRNONAA 44 (L) 03/18/2017                             Hepatic Function Markers Lab Results  Component Value Date   AST 21 03/18/2017   ALT 12 (L) 03/18/2017   ALBUMIN 3.9 03/18/2017   ALKPHOS 47 03/18/2017   LIPASE 28 03/18/2017                        Electrolytes Lab Results  Component Value Date   NA 138 03/18/2017   K 3.6 03/18/2017   CL 101 03/18/2017   CALCIUM 9.4 03/18/2017   MG 1.8 06/27/2016                        Neuropathy Markers No results found for: VITAMINB12, FOLATE, HGBA1C, HIV                      CNS Tests No results found for: COLORCSF, APPEARCSF, RBCCOUNTCSF, WBCCSF, POLYSCSF, LYMPHSCSF, EOSCSF, PROTEINCSF, GLUCCSF, JCVIRUS, CSFOLI, IGGCSF                      Bone Pathology Markers No results found for: VD25OH, [id number], [id number], [id number], 25OHVITD1, 25OHVITD2, 25OHVITD3, TESTOFREE, TESTOSTERONE                       Coagulation Parameters Lab Results  Component Value Date   INR 2.24 06/28/2016   LABPROT 25.2 (H) 06/28/2016   APTT 96.0 (H) 06/09/2013   PLT 264 03/18/2017                        Cardiovascular Markers Lab Results  Component Value Date   BNP 712.0 (H) 03/10/2017   CKTOTAL 35 (L) 06/26/2016   CKMB 2.3  06/08/2013   TROPONINI <0.03 03/10/2017   HGB 13.5 03/18/2017   HCT 40.0 03/18/2017                         CA Markers No results found for: CEA, CA125, LABCA2                      Note: Lab results reviewed.  [place]  Drug: Ms. Dacy  reports that she does not use drugs. Alcohol:   reports that she does not drink alcohol. Tobacco:  reports that she has quit smoking. Her smoking use included cigarettes. She has a 5.00 pack-year smoking history. She has never used smokeless tobacco. Medical:  has a past medical history of Anxiety, Arthritis, Atrial fibrillation ([place]), CHF (congestive heart failure) ([place]), Chronic kidney disease, Depression, Dysrhythmia, HLD (hyperlipidemia), Hypertension, Mitral valve stenosis, and Stroke ([place]). Family: family history includes Asthma in her father; Heart failure in her mother.  Past Surgical History:  Procedure Laterality Date  . ABDOMINAL HYSTERECTOMY    . CATARACT EXTRACTION W/PHACO Right 07/31/2017   Procedure: CATARACT EXTRACTION PHACO AND INTRAOCULAR LENS PLACEMENT (IOC);  Surgeon: Birder Robson, MD;  Location: [facility] ORS;  Service: Ophthalmology;  Laterality: Right;  [place] 00:33.8 AP% 15.7 CDE 5.28 Fluid pack lot # [id number]  . CATARACT EXTRACTION W/PHACO Left 08/26/2017   Procedure: CATARACT EXTRACTION PHACO AND INTRAOCULAR LENS PLACEMENT (IOC);  Surgeon: Birder Robson, MD;  Location: [facility] ORS;  Service: Ophthalmology;  Laterality: Left;  [place] 00:38 AP% 15.3 CDE 5.95 Fluid pack lot # [id number]  . FRACTURE SURGERY    . HIP FRACTURE SURGERY    . JOINT REPLACEMENT    . REPLACEMENT TOTAL KNEE BILATERAL     Active Ambulatory Problems    Diagnosis Date Noted  . Acute systolic CHF (congestive heart failure) ([place]) 06/27/2016  . AF (paroxysmal atrial fibrillation) ([place]) 06/27/2016  . Anxiety 06/27/2016  . HTN (hypertension) 06/27/2016  . HLD (hyperlipidemia) 06/27/2016  . Chronic diastolic heart failure ([place]) 07/14/2016  . Chronic bilateral low back pain with bilateral sciatica 11/11/2017  . Lumbar facet arthropathy 11/11/2017  . Foraminal stenosis of lumbar region 11/11/2017  . Lumbar disc herniation 11/11/2017  . Chronic pain syndrome 11/11/2017   Resolved Ambulatory Problems    Diagnosis Date Noted  . No Resolved Ambulatory  Problems   Past Medical History:  Diagnosis Date  . Arthritis   . Atrial fibrillation ([place])   . CHF (congestive heart failure) ([place])   . Chronic kidney disease   . Depression   . Dysrhythmia   . Hypertension   . Mitral valve stenosis   . Stroke [facility])    Constitutional Exam  General appearance: alert and cooperative Vitals:   11/11/17 0848  BP: 140/80  Pulse: 80  Resp: 16  Temp: 97.7 F (36.5 C)  TempSrc: Oral  SpO2: 100%  Weight: 145 lb (65.8 kg)  Height: 5' (1.524 m)   BMI Assessment: Estimated body mass index is 28.32 kg/m as calculated from the following:   Height as of this encounter: 5' (1.524 m).   Weight as of this encounter: 145 lb (65.8 kg).  BMI interpretation table: BMI level Category Range association with higher incidence of chronic pain  <18 kg/m2 Underweight   18.5-24.9 kg/m2 Ideal body weight   25-29.9 kg/m2 Overweight Increased incidence by 20%  30-34.9 kg/m2 Obese (Class I) Increased incidence by 68%  35-39.9 kg/m2 Severe obesity (Class II) Increased incidence by  136%  >40 kg/m2 Extreme obesity (Class III) Increased incidence by 254%   Patient's current BMI Ideal Body weight  Body mass index is 28.32 kg/m. Ideal body weight: 45.5 kg (100 lb 4.9 oz) Adjusted ideal body weight: 53.6 kg (118 lb 3 oz)   BMI Readings from Last 4 Encounters:  11/11/17 28.32 kg/m  08/21/17 29.29 kg/m  07/31/17 29.29 kg/m  03/18/17 26.95 kg/m   Wt Readings from Last 4 Encounters:  11/11/17 145 lb (65.8 kg)  08/21/17 150 lb (68 kg)  07/31/17 150 lb (68 kg)  03/18/17 138 lb (62.6 kg)  Psych/Mental status: Alert, oriented x 3 (person, place, & time)       Eyes: PERLA Respiratory: No evidence of acute respiratory distress  Cervical Spine Area Exam  Skin & Axial Inspection: No masses, redness, edema, swelling, or associated skin lesions Alignment: Symmetrical Functional ROM: Unrestricted ROM      Stability: No instability detected Muscle Tone/Strength:  Functionally intact. No obvious neuro-muscular anomalies detected. Sensory (Neurological): Musculoskeletal pain pattern Palpation: No palpable anomalies              Upper Extremity (UE) Exam    Side: Right upper extremity  Side: Left upper extremity  Skin & Extremity Inspection: Skin color, temperature, and hair growth are WNL. No peripheral edema or cyanosis. No masses, redness, swelling, asymmetry, or associated skin lesions. No contractures.  Skin & Extremity Inspection: Skin color, temperature, and hair growth are WNL. No peripheral edema or cyanosis. No masses, redness, swelling, asymmetry, or associated skin lesions. No contractures.  Functional ROM: Unrestricted ROM          Functional ROM: Unrestricted ROM          Muscle Tone/Strength: Functionally intact. No obvious neuro-muscular anomalies detected.  Muscle Tone/Strength: Functionally intact. No obvious neuro-muscular anomalies detected.  Sensory (Neurological): Unimpaired          Sensory (Neurological): Unimpaired          Palpation: No palpable anomalies              Palpation: No palpable anomalies              Provocative Test(s):  Phalen's test: deferred Tinel's test: deferred Apley's scratch test (touch opposite shoulder):  Action 1 (Across chest): deferred Action 2 (Overhead): deferred Action 3 (LB reach): deferred   Provocative Test(s):  Phalen's test: deferred Tinel's test: deferred Apley's scratch test (touch opposite shoulder):  Action 1 (Across chest): deferred Action 2 (Overhead): deferred Action 3 (LB reach): deferred    Thoracic Spine Area Exam  Skin & Axial Inspection: No masses, redness, or swelling Alignment: Symmetrical Functional ROM: Unrestricted ROM Stability: No instability detected Muscle Tone/Strength: Functionally intact. No obvious neuro-muscular anomalies detected. Sensory (Neurological): Unimpaired Muscle strength & Tone: No palpable anomalies  Lumbar Spine Area Exam  Skin & Axial  Inspection: Lumbar Scoliosis Alignment: Levoscoliosis Functional ROM: Decreased ROM       Stability: No instability detected Muscle Tone/Strength: Increased muscle tone over affected area Sensory (Neurological): Musculoskeletal pain pattern & dermatomal Palpation: No palpable anomalies       Provocative Tests: Hyperextension/rotation test: (+) bilaterally for facet joint pain. Lumbar quadrant test (Kemp's test): (+) bilaterally for facet joint pain. Lateral bending test: deferred today       Patrick's Maneuver: deferred today                   FABER test: deferred today  S-I anterior distraction/compression test: deferred today         S-I lateral compression test: deferred today         S-I Thigh-thrust test: deferred today         S-I Gaenslen's test: deferred today          Gait & Posture Assessment  Ambulation: Limited Gait: Limited. Using assistive device to ambulate Posture: Difficulty standing up straight, due to pain   Lower Extremity Exam    Side: Right lower extremity  Side: Left lower extremity  Stability: No instability observed          Stability: No instability observed          Skin & Extremity Inspection: Skin color, temperature, and hair growth are WNL. No peripheral edema or cyanosis. No masses, redness, swelling, asymmetry, or associated skin lesions. No contractures.  Skin & Extremity Inspection: Skin color, temperature, and hair growth are WNL. No peripheral edema or cyanosis. No masses, redness, swelling, asymmetry, or associated skin lesions. No contractures.  Functional ROM: Decreased ROM for all joints of the lower extremity          Functional ROM: Decreased ROM for all joints of the lower extremity          Muscle Tone/Strength: Functionally intact. No obvious neuro-muscular anomalies detected.  Muscle Tone/Strength: Functionally intact. No obvious neuro-muscular anomalies detected.  Sensory (Neurological): Arthropathic arthralgia and  dermatomal  Sensory (Neurological): Musculoskeletal pain pattern and dermatomal  Palpation: No palpable anomalies  Palpation: No palpable anomalies   Assessment  Primary Diagnosis & Pertinent Problem List: The primary encounter diagnosis was Chronic bilateral low back pain with bilateral sciatica. Diagnoses of Lumbar facet arthropathy, Lumbar disc herniation, Foraminal stenosis of lumbar region, and Chronic pain syndrome were also pertinent to this visit.  Visit Diagnosis (New problems to examiner): 1. Chronic bilateral low back pain with bilateral sciatica   2. Lumbar facet arthropathy   3. Lumbar disc herniation   4. Foraminal stenosis of lumbar region   5. Chronic pain syndrome    Plan of Care (Initial workup plan)   General Recommendations: The pain condition that the patient suffers from is best treated with a multidisciplinary approach that involves an increase in physical activity to prevent de-conditioning and worsening of the pain cycle, as well as psychological counseling (formal and/or informal) to address the co-morbid psychological affects of pain. Treatment will often involve judicious use of pain medications and interventional procedures to decrease the pain, allowing the patient to participate in the physical activity that will ultimately produce long-lasting pain reductions. The goal of the multidisciplinary approach is to return the patient to a higher level of overall function and to restore their ability to perform activities of daily living.  84 year old female with a chief complaint of axial low back pain with radiation to bilateral lower legs secondary to lumbar spinal stenosis, foraminal stenosis, facet arthropathy, lumbar degenerative disc disease along with compression fracture.  Patient also has a significant cardiac history and is currently anticoagulated with warfarin.  She has tried epidural steroid injections in the past which were not effective.  I do not recommend  repeating this or any other interventional procedures as she has high risk to be off of her warfarin.  Furthermore this is an elective procedure that will be diagnostic and the risks of being off of anticoagulation do not outweigh the benefits.  Patient has tried a prednisone taper in the past which she states was not effective.  She states that her friend in [place] obtained good relief with methylprednisolone and we can try that.  Patient wants to stay away from opioid medications which I agree with.  Steroid taper as below, patient can follow-up as needed.  Recommend medication management to continue with PCP.  Pharmacotherapy (current): Medications ordered:  Meds ordered this encounter  Medications  . methylPREDNISolone (MEDROL) 4 MG TBPK tablet    Sig: Follow package instructions.    Dispense:  21 tablet    Refill:  0    Do not add to the "Automatic Refill" notification system.   Medications administered during this visit: Aleayah [place]. Jimmerson had no medications administered during this visit.   Interventional management options: Ms. Shrestha was informed that there is no guarantee that she would be a candidate for interventional therapies. The decision will be based on the results of diagnostic studies, as well as Ms. Mcadory's risk profile.  Procedure(s) under consideration:   None, patient high risk to be off her chronic anticoagulation, warfarin   Provider-requested follow-up: Return if symptoms worsen or fail to improve.  No future appointments.  Primary Care Physician: Glendon Axe, MD Location: [facility] Note by: Gillis Santa, M.D, Date: 11/11/2017; Time: 2:12 PM  Patient Instructions  A prescriptions for Medrol Dosepack was sent to your pharmacy.

## 2017-11-11 NOTE — Telephone Encounter (Signed)
She forgot to tell you that she has been on antibiotics, she took the last on Saturday. Should she still take the prednisone he gave her today?

## 2017-11-11 NOTE — Progress Notes (Signed)
Safety precautions to be maintained throughout the outpatient stay will include: orient to surroundings, keep bed in low position, maintain call bell within reach at all times, provide assistance with transfer out of bed and ambulation.  

## 2017-11-11 NOTE — Patient Instructions (Signed)
A prescriptions for Medrol Dosepack was sent to your pharmacy.

## 2017-11-21 ENCOUNTER — Telehealth: Payer: Self-pay | Admitting: Student in an Organized Health Care Education/Training Program

## 2017-11-21 NOTE — Telephone Encounter (Signed)
Letter received and placed on Dr Holley Raring desk to sign. He is out of [place] next week and will not be returning  Until 12/01/17

## 2017-11-21 NOTE — Telephone Encounter (Signed)
Marcy Siren at [employer] sent form for renewed orders to continue home PT for patient and need to get that signed and sent back so they can continue treatment for patient.  [phone] phone Marcy Siren

## 2017-12-06 ENCOUNTER — Emergency Department (HOSPITAL_COMMUNITY): Payer: Medicare Other

## 2017-12-06 ENCOUNTER — Inpatient Hospital Stay (HOSPITAL_COMMUNITY): Payer: Medicare Other

## 2017-12-06 ENCOUNTER — Observation Stay (HOSPITAL_COMMUNITY)
Admission: EM | Admit: 2017-12-06 | Discharge: 2017-12-07 | Disposition: A | Discharge status: Home / Self Care | Payer: Medicare Other | Attending: Neurology | Admitting: Neurology

## 2017-12-06 DIAGNOSIS — I130 Hypertensive heart and chronic kidney disease with heart failure and stage 1 through stage 4 chronic kidney disease, or unspecified chronic kidney disease: Secondary | ICD-10-CM | POA: Insufficient documentation

## 2017-12-06 DIAGNOSIS — M1990 Unspecified osteoarthritis, unspecified site: Secondary | ICD-10-CM | POA: Diagnosis not present

## 2017-12-06 DIAGNOSIS — I10 Essential (primary) hypertension: Secondary | ICD-10-CM

## 2017-12-06 DIAGNOSIS — I050 Rheumatic mitral stenosis: Secondary | ICD-10-CM | POA: Diagnosis not present

## 2017-12-06 DIAGNOSIS — E669 Obesity, unspecified: Secondary | ICD-10-CM | POA: Diagnosis not present

## 2017-12-06 DIAGNOSIS — I5021 Acute systolic (congestive) heart failure: Secondary | ICD-10-CM

## 2017-12-06 DIAGNOSIS — I4891 Unspecified atrial fibrillation: Secondary | ICD-10-CM | POA: Diagnosis not present

## 2017-12-06 DIAGNOSIS — F419 Anxiety disorder, unspecified: Secondary | ICD-10-CM | POA: Diagnosis not present

## 2017-12-06 DIAGNOSIS — I639 Cerebral infarction, unspecified: Secondary | ICD-10-CM | POA: Diagnosis present

## 2017-12-06 DIAGNOSIS — N189 Chronic kidney disease, unspecified: Secondary | ICD-10-CM | POA: Diagnosis not present

## 2017-12-06 DIAGNOSIS — F329 Major depressive disorder, single episode, unspecified: Secondary | ICD-10-CM | POA: Diagnosis not present

## 2017-12-06 DIAGNOSIS — I63411 Cerebral infarction due to embolism of right middle cerebral artery: Secondary | ICD-10-CM

## 2017-12-06 DIAGNOSIS — Z87891 Personal history of nicotine dependence: Secondary | ICD-10-CM | POA: Diagnosis not present

## 2017-12-06 DIAGNOSIS — R29810 Facial weakness: Secondary | ICD-10-CM | POA: Diagnosis not present

## 2017-12-06 DIAGNOSIS — I509 Heart failure, unspecified: Secondary | ICD-10-CM | POA: Insufficient documentation

## 2017-12-06 DIAGNOSIS — G459 Transient cerebral ischemic attack, unspecified: Principal | ICD-10-CM

## 2017-12-06 DIAGNOSIS — Z8673 Personal history of transient ischemic attack (TIA), and cerebral infarction without residual deficits: Secondary | ICD-10-CM | POA: Diagnosis not present

## 2017-12-06 DIAGNOSIS — Z7901 Long term (current) use of anticoagulants: Secondary | ICD-10-CM | POA: Diagnosis not present

## 2017-12-06 DIAGNOSIS — Z79899 Other long term (current) drug therapy: Secondary | ICD-10-CM | POA: Diagnosis not present

## 2017-12-06 DIAGNOSIS — E785 Hyperlipidemia, unspecified: Secondary | ICD-10-CM

## 2017-12-06 DIAGNOSIS — I480 Paroxysmal atrial fibrillation: Secondary | ICD-10-CM

## 2017-12-06 DIAGNOSIS — R4781 Slurred speech: Secondary | ICD-10-CM | POA: Diagnosis not present

## 2017-12-06 DIAGNOSIS — K08409 Partial loss of teeth, unspecified cause, unspecified class: Secondary | ICD-10-CM

## 2017-12-06 LAB — I-STAT CHEM 8, ED
BUN: 19 mg/dL (ref 8–23)
CALCIUM ION: 1.13 mmol/L — AB (ref 1.15–1.40)
CREATININE: 1.2 mg/dL — AB (ref 0.44–1.00)
Chloride: 101 mmol/L (ref 98–111)
Glucose, Bld: 112 mg/dL — ABNORMAL HIGH (ref 70–99)
HCT: 35 % — ABNORMAL LOW (ref 36.0–46.0)
Hemoglobin: 11.9 g/dL — ABNORMAL LOW (ref 12.0–15.0)
Potassium: 3.8 mmol/L (ref 3.5–5.1)
SODIUM: 139 mmol/L (ref 135–145)
TCO2: 31 mmol/L (ref 22–32)

## 2017-12-06 LAB — COMPREHENSIVE METABOLIC PANEL
ALT: 21 U/L (ref 0–44)
AST: 23 U/L (ref 15–41)
Albumin: 4 g/dL (ref 3.5–5.0)
Alkaline Phosphatase: 53 U/L (ref 38–126)
Anion gap: 6 (ref 5–15)
BILIRUBIN TOTAL: 1.5 mg/dL — AB (ref 0.3–1.2)
BUN: 18 mg/dL (ref 8–23)
CO2: 28 mmol/L (ref 22–32)
CREATININE: 1.04 mg/dL — AB (ref 0.44–1.00)
Calcium: 9.4 mg/dL (ref 8.9–10.3)
Chloride: 104 mmol/L (ref 98–111)
GFR, EST AFRICAN AMERICAN: 55 mL/min — AB (ref >60)
GFR, EST NON AFRICAN AMERICAN: 47 mL/min — AB (ref >60)
Glucose, Bld: 115 mg/dL — ABNORMAL HIGH (ref 70–99)
POTASSIUM: 3.8 mmol/L (ref 3.5–5.1)
Sodium: 138 mmol/L (ref 135–145)
TOTAL PROTEIN: 6.3 g/dL — AB (ref 6.5–8.1)

## 2017-12-06 LAB — HEMOGLOBIN A1C
HEMOGLOBIN A1C: 5.4 % (ref 4.8–5.6)
MEAN PLASMA GLUCOSE: 108.28 mg/dL

## 2017-12-06 LAB — DIFFERENTIAL
Abs Immature Granulocytes: 0.02 10*3/uL (ref 0.00–0.07)
Basophils Absolute: 0.1 10*3/uL (ref 0.0–0.1)
Basophils Relative: 1 %
EOS ABS: 0.5 10*3/uL (ref 0.0–0.5)
Eosinophils Relative: 7 %
Immature Granulocytes: 0 %
LYMPHS ABS: 0.9 10*3/uL (ref 0.7–4.0)
Lymphocytes Relative: 14 %
MONO ABS: 0.7 10*3/uL (ref 0.1–1.0)
MONOS PCT: 10 %
Neutro Abs: 4.6 10*3/uL (ref 1.7–7.7)
Neutrophils Relative %: 68 %

## 2017-12-06 LAB — PROTIME-INR
INR: 1.1
PROTHROMBIN TIME: 14.1 s (ref 11.4–15.2)

## 2017-12-06 LAB — CBC
HCT: 37.8 % (ref 36.0–46.0)
Hemoglobin: 12.1 g/dL (ref 12.0–15.0)
MCH: 28.7 pg (ref 26.0–34.0)
MCHC: 32 g/dL (ref 30.0–36.0)
MCV: 89.8 fL (ref 80.0–100.0)
PLATELETS: 220 10*3/uL (ref 150–400)
RBC: 4.21 MIL/uL (ref 3.87–5.11)
RDW: 13.4 % (ref 11.5–15.5)
WBC: 6.8 10*3/uL (ref 4.0–10.5)
nRBC: 0 % (ref 0.0–0.2)

## 2017-12-06 LAB — I-STAT TROPONIN, ED: Troponin i, poc: 0.02 ng/mL (ref 0.00–0.08)

## 2017-12-06 LAB — CBG MONITORING, ED: Glucose-Capillary: 107 mg/dL — ABNORMAL HIGH (ref 70–99)

## 2017-12-06 LAB — APTT: APTT: 30 s (ref 24–36)

## 2017-12-06 LAB — MRSA PCR SCREENING: MRSA BY PCR: NEGATIVE

## 2017-12-06 MED ORDER — SODIUM CHLORIDE 0.9 % IV SOLN
INTRAVENOUS | Status: DC
  Administered 2017-12-06 – 2017-12-07 (×2): via INTRAVENOUS

## 2017-12-06 MED ORDER — ACETAMINOPHEN 650 MG RE SUPP: 650.0000 mg | RECTAL | Status: DC | PRN

## 2017-12-06 MED ORDER — SOTALOL HCL 80 MG PO TABS
80.0000 mg | ORAL_TABLET | Freq: Two times a day (BID) | ORAL | Status: DC
  Administered 2017-12-07 (×2): 80 mg via ORAL
  Filled 2017-12-06 (×3): qty 1

## 2017-12-06 MED ORDER — METOPROLOL TARTRATE 25 MG PO TABS: 25.0000 mg | ORAL_TABLET | Freq: Two times a day (BID) | ORAL | Status: DC

## 2017-12-06 MED ORDER — ALBUTEROL SULFATE (2.5 MG/3ML) 0.083% IN NEBU: 2.5000 mg | INHALATION_SOLUTION | Freq: Four times a day (QID) | RESPIRATORY_TRACT | Status: DC | PRN

## 2017-12-06 MED ORDER — SENNOSIDES-DOCUSATE SODIUM 8.6-50 MG PO TABS: 1.0000 | ORAL_TABLET | Freq: Every evening | ORAL | Status: DC | PRN

## 2017-12-06 MED ORDER — ACETAMINOPHEN 160 MG/5ML PO SOLN: 650.0000 mg | ORAL | Status: DC | PRN

## 2017-12-06 MED ORDER — IOPAMIDOL (ISOVUE-370) INJECTION 76%
INTRAVENOUS | Status: AC
  Administered 2017-12-06: 50 mL
  Filled 2017-12-06: qty 50

## 2017-12-06 MED ORDER — METOPROLOL TARTRATE 25 MG PO TABS
25.0000 mg | ORAL_TABLET | Freq: Two times a day (BID) | ORAL | Status: DC
  Administered 2017-12-06 – 2017-12-07 (×2): 25 mg via ORAL
  Filled 2017-12-06 (×2): qty 1

## 2017-12-06 MED ORDER — ACETAMINOPHEN 325 MG PO TABS: 650.0000 mg | ORAL_TABLET | ORAL | Status: DC | PRN

## 2017-12-06 MED ORDER — IOPAMIDOL (ISOVUE-370) INJECTION 76%
INTRAVENOUS | Status: AC
  Administered 2017-12-06: 40 mL
  Filled 2017-12-06: qty 50

## 2017-12-06 MED ORDER — STROKE: EARLY STAGES OF RECOVERY BOOK
Freq: Once | Status: AC
  Administered 2017-12-06: 23:00:00
  Filled 2017-12-06: qty 1

## 2017-12-06 NOTE — H&P (Addendum)
NEURO HOSPITALIST H&P   Requesting Physician: Dr. Laverta Baltimore    Chief Complaint: Right gaze/ left side weakness/ slurred speech  History obtained from:  Patient    HPI:                                                                                                                                         Theresa Carrillo is an 84 y.o. female  With PMH CVA ( 2013 ), HTN, HLD, CHF, a. Fib ( coumadin) who presented to [facility] ED as a code stroke for right gaze, facial droop, unresponsiveness.  Patient was eating lunch at a senior center and suddenly went unresponsive, left side was weak, right side gaze, facial droop. Patient does not remember anything that happened. She insist that " she is fine, she is going home, and EMS kidnapped her". Denies any vision changes, HA, CP, SOB or any weakness. Patient states that her last dose of coumadin was yesterday and she was headed home to take her dose today due at 1400. Per daughter patient recently had two teeth removed and has bot been taking her coumadin. She re-started her coumadin yesterday. This is why her INR  is sub-therapeutic.  ED course:  Ct head: no hemorrhage; hyperdense right MCA CTA: distal right M1 occlusion  BP:170/77 BG:112 Creatinine: 1.20 , INR: 1.10   12/14/2011 stroke seen on MRI. Per patient she had a "silent" stroke many years before that. No residual deficits   Date last known well: Date: 12/06/2017 Time last known well: Time: 13:30 tPA Given: No: on coumadin Modified Rankin: Rankin Score=1 NIHSS:0    Past Medical History:  Diagnosis Date  . Anxiety   . Arthritis   . Atrial fibrillation ([place])   . CHF (congestive heart failure) ([place])   . Chronic kidney disease    RENAL INSUFF  . Depression   . Dysrhythmia   . HLD (hyperlipidemia)   . Hypertension   . Mitral valve stenosis   . Stroke [facility])     Past Surgical History:  Procedure Laterality Date  . ABDOMINAL HYSTERECTOMY     . CATARACT EXTRACTION W/PHACO Right 07/31/2017   Procedure: CATARACT EXTRACTION PHACO AND INTRAOCULAR LENS PLACEMENT (IOC);  Surgeon: Birder Robson, MD;  Location: [facility] ORS;  Service: Ophthalmology;  Laterality: Right;  [place] 00:33.8 AP% 15.7 CDE 5.28 Fluid pack lot # [id number]  . CATARACT EXTRACTION W/PHACO Left 08/26/2017   Procedure: CATARACT EXTRACTION PHACO AND INTRAOCULAR LENS PLACEMENT (IOC);  Surgeon: Birder Robson, MD;  Location: [facility] ORS;  Service: Ophthalmology;  Laterality: Left;  [place] 00:38 AP% 15.3 CDE 5.95 Fluid pack lot # [id number]  .  FRACTURE SURGERY    . HIP FRACTURE SURGERY    . JOINT REPLACEMENT    . REPLACEMENT TOTAL KNEE BILATERAL      Family History  Problem Relation Age of Onset  . Heart failure Mother   . Asthma Father          Social History:  reports that she has quit smoking. Her smoking use included cigarettes. She has a 5.00 pack-year smoking history. She has never used smokeless tobacco. She reports that she does not drink alcohol or use drugs.  Allergies: No Known Allergies  Medications:                                                                                                                          Current Facility-Administered Medications  Medication Dose Route Frequency Provider Last Rate Last Dose  . iopamidol (ISOVUE-370) 76 % injection            Current Outpatient Medications  Medication Sig Dispense Refill  . albuterol (PROVENTIL HFA;VENTOLIN HFA) 108 (90 Base) MCG/ACT inhaler Inhale 2 puffs into the lungs every 6 (six) hours as needed for wheezing or shortness of breath. (Patient not taking: Reported on 07/25/2017) 1 Inhaler 2  . Ascorbic Acid (VITAMIN C) 1000 MG tablet Take 1,000 mg by mouth daily.    . calcium carbonate (OS-CAL - DOSED IN MG OF ELEMENTAL CALCIUM) 1250 (500 Ca) MG tablet Take 500 mg by mouth daily.    . Cholecalciferol (VITAMIN D3) 1000 units CAPS Take 1,000 Units by mouth daily.    Marland Kitchen doxepin (SINEQUAN) 10 MG  capsule Take 10 mg by mouth 4 (four) times daily.     . furosemide (LASIX) 20 MG tablet Take 20 mg by mouth daily.    Marland Kitchen losartan (COZAAR) 25 MG tablet Take 25 mg by mouth daily.     . metoprolol tartrate (LOPRESSOR) 25 MG tablet Take 1 tablet (25 mg total) by mouth 2 (two) times daily. 60 tablet 0  . Multiple Vitamin (MULTI-VITAMINS) TABS Take 1 tablet by mouth daily.    . Omega-3 1000 MG CAPS Take 1,000 g by mouth daily.    . sotalol (BETAPACE) 80 MG tablet Take 1 tablet (80 mg total) by mouth daily. (Patient taking differently: Take 80 mg by mouth 2 (two) times daily. ) 30 tablet 0  . temazepam (RESTORIL) 15 MG capsule Take 15 mg by mouth at bedtime.    . triamcinolone cream (KENALOG) 0.1 % Apply 1 application topically daily as needed (skin irritation).    . warfarin (COUMADIN) 1 MG tablet Take 3 mg by mouth 2 (two) times a week. Tuesday, Saturday and Sunday    . warfarin (COUMADIN) 4 MG tablet Take 4 mg by mouth. Monday, Wednesday, Thursday and Friday       ROS:  ROS was performed and is negative except as noted in HPI  General Examination:                                                                                                      There were no vitals taken for this visit.  HEENT-  Normocephalic, no lesions, without obvious abnormality.  Normal external eye and conjunctiva.  Cardiovascular- S1-S2 audible, pulses palpable throughout   Lungs-no rhonchi or wheezing noted, no excessive working breathing.  Saturations within normal limits on RA Abdomen- All 4 quadrants palpated and nontender Extremities- Warm, dry and intact Musculoskeletal-no joint tenderness, deformity or swelling Skin-warm and dry, no hyperpigmentation, vitiligo, or suspicious lesions  Neurological Examination Mental Status: Alert, oriented, person/place/age/ DOB/month/ year  thought content appropriate. No dysarthria. Speech fluent without evidence of aphasia.  Able to follow  commands without difficulty.  Cranial Nerves: II:  Visual fields grossly normal,  III,IV, VI: ptosis not present, extra-ocular motions intact bilaterally, pupils equal, round, reactive to light and accommodation V,VII: smile symmetric, facial light touch sensation normal bilaterally VIII: hearing normal bilaterally IX,X: uvula rises symmetrically XI: bilateral shoulder shrug XII: midline tongue extension Motor: Right : Upper extremity   5/5 Left:     Upper extremity   5/5  Lower extremity   5/5  Lower extremity   5/5 Tone and bulk:normal tone throughout; no atrophy noted Sensory:  light touch intact throughout, bilaterally Deep Tendon Reflexes: 2+ and symmetric biceps and patella Plantars: Right: downgoing   Left: downgoing Cerebellar: normal finger-to-nose, normal rapid alternating movements and normal heel-to-shin test Gait: short stride length, unsteady gait   Lab Results: Basic Metabolic Panel: Recent Labs  Lab 12/06/17 1431  NA 139  K 3.8  CL 101  GLUCOSE 112*  BUN 19  CREATININE 1.20*    CBC: Recent Labs  Lab 12/06/17 1428 12/06/17 1431  WBC 6.8  --   NEUTROABS 4.6  --   HGB 12.1 11.9*  HCT 37.8 35.0*  MCV 89.8  --   PLT 220  --     CBG: Recent Labs  Lab 12/06/17 1427  GLUCAP 107*    Imaging: Ct Head Code Stroke Wo Contrast  Result Date: 12/06/2017 CLINICAL DATA:  Code stroke. 84 year old female with syncopal episode. Left side weakness and rightward gaze. Last seen normal 1330 hours. EXAM: CT HEAD WITHOUT CONTRAST TECHNIQUE: Contiguous axial images were obtained from the base of the skull through the vertex without intravenous contrast. COMPARISON:  Head and cervical spine CT 06/26/2016. FINDINGS: Brain: Stable cerebral volume. No ventriculomegaly. No midline shift, mass effect, or evidence of intracranial mass lesion. No acute intracranial  hemorrhage identified. Chronic basal ganglia lacunar infarcts. Relatively mild cerebral white matter hypodensity elsewhere appears stable. No cortically based acute infarct identified. There is a small area of chronic encephalomalacia in the right posterior temporal lobe on coronal image 43. Vascular: Calcified atherosclerosis at the skull base. Positive for hyperdense distal right MCA near the bifurcation which is new since 2018 on series 5, image 31 and series 6, image 21. Skull: No acute  osseous abnormality identified. Sinuses/Orbits: Visualized paranasal sinuses and mastoids are stable and well pneumatized. Other: Postoperative changes to both globes since 2018. Scalp soft tissues appear stable and negative. ASPECTS [facility] Stroke Program Early CT Score) - Ganglionic level infarction (caudate, lentiform nuclei, internal capsule, insula, M1-M3 cortex): 7 - Supraganglionic infarction (M4-M6 cortex): 3 Total score (0-10 with 10 being normal): 10 IMPRESSION: 1. Positive for hyperdense right MCA, but no CT changes of acute right MCA infarct and no acute hemorrhage. ASPECTS is 10. 2. Chronic small vessel disease in both hemispheres appears stable since 2018. 3. These results were communicated to Dr. Lorraine Lax at 2:47 pmon 10/26/2019by text page via the [facility] messaging system. Electronically Signed   By: Genevie Ann M.D.   On: 12/06/2017 14:48    Laurey Morale, MSN, NP-C [facility] [phone]   12/06/2017, 2:41 PM   Attending physician note to follow with Assessment and plan .   Assessment: 84 y.o. female With PMH CVA ( 2013 ), HTN, HLD, CHF, a. Fib (coumadin) who presented to [facility] ED as a code stroke for right gaze, facial droop, unresponsiveness. CTH: no hemorrhage. Hyperdense right MCA. CTA:  Distal M1 occlusion. Patient with an NIHSS of 0. It remained a zero during multiple reassessments. Patient was walked and NIHSS remained 0.  Therefore patient did not receive IV TPA as symptoms were too  mild.  She under went a CT perfusion which showed no core, penumbra 26 mL.  We will admit patient to ICU for close monitoring in case if deterioration in neurological status.  The patient develops new symptoms, she will reassess for TPA and possible mechanical thrombectomy.  We will obtain a stat MRI brain for now.   Impression: Stroke Risk Factors - atrial fibrillation and hypertension    Plan: --MRI Brain  --CTA --tele - HOLD coumadin at this time.   CNS -Close neuro monitoring -MRI CTA  RESP No acute problems Continue to monitor  CV Essential (primary) hypertension -tele  GI/GU -Continue dialysis -Gentle hydration -avoid nephrotoxic agents -renal consult - doc/senna  ENDO -goal HgbA1c < 7  ID Possible Aspiration PNA -CXR -NPO -Monitor  Diet: NPO until cleared by speech or bedside swallow eval  Code Status: DNR/DNI   --please page stroke NP  Or  PA  Or MD from 8am -4 pm  as this patient from this time will be  followed by the stroke.   You can look them up on [email or web address]  Password [email or web address]     NEUROHOSPITALIST ADDENDUM Performed a face to face diagnostic evaluation.   I have reviewed the contents of history and physical exam as documented by PA/ARNP/Resident and agree with above documentation.  I have discussed and formulated the above plan as documented. Edits to the note have been made as needed.   84 y.o. female  With PMH CVA ( 2013 ), HTN, HLD, CHF, a. Fib ( coumadin) who presented as a stroke alert for left-sided weakness and right gaze deviation.  However symptoms improved completely prior to arrival to [facility], ER.  On arrival, patient was back at her baseline.  No neglect noted to left side.  CT head showed a hyperdense right MCA and CT angiogram confirmed a right M1 occlusion. Administering TPA was debated however given that she was neurologically at her baseline and was even able to walk with support(usually walks with a walker) -with no  fluctuation of symptoms, TPA was deferred.  CT perfusion was obtained which showed only 26 mL  of penumbra.  Therefore we did not feel she was a thrombectomy candidate with NIH stroke scale of 0.   It is possible that it is a chronic occlusion.  However, given acuity of presentation we will admit her to the neuro ICU for close observation.  Bedrest and if any deterioration to notify neurology on-call.  I spoke with my colleague and he understands this patient if worsens then he will reassess the patient for TPA and possible thrombectomy.  In the meantime she is undergoing MRI brain-we will see what it shows and decide on starting anticoagulation.   This patient is neurologically critically ill due to large vessel occlusion in the right MCA territory.  She is at risk for significant risk of neurological worsening from a stroke,  cerebral edema, heart failure, hemorrhagic conversion, infection, respiratory failure and seizure. This patient's care requires constant monitoring of vital signs, hemodynamics, respiratory and cardiac monitoring, review of multiple databases, neurological assessment, discussion with family, other specialists and medical decision making of high complexity.  I spent 70  minutes of neurocritical time in the care of this patient.      Karena Addison Aroor MD [facility] [phone]   If 7pm to 7am, please call on call as listed on AMION.

## 2017-12-06 NOTE — ED Provider Notes (Signed)
[facility] EMERGENCY DEPARTMENT Provider Note   CSN: [id number] Arrival date & time: 12/06/17  1425     History   Chief Complaint Chief Complaint  Patient presents with  . Code Stroke    HPI Theresa Carrillo is a 84 y.o. female.  HPI Theresa Carrillo is an 84 y.o. female  With PMH CVA ( 2013 ), HTN, HLD, CHF, a. Fib ( coumadin) who presented to [facility] ED as a code stroke for right gaze, facial droop, unresponsiveness.  Patient was eating lunch at a senior center and suddenly went unresponsive, left side was weak, right side gaze, facial droop. Patient does not remember anything that happened. She insist that " she is fine, she is going home, and EMS kidnapped her". Denies any vision changes, HA, CP, SOB or any weakness. Patient states that her last dose of coumadin was yesterday and she was headed home to take her dose today due at 1400. Past Medical History:  Diagnosis Date  . Anxiety   . Arthritis   . Atrial fibrillation ([place])   . CHF (congestive heart failure) ([place])   . Chronic kidney disease    RENAL INSUFF  . Depression   . Dysrhythmia   . HLD (hyperlipidemia)   . Hypertension   . Mitral valve stenosis   . Stroke [facility])     Patient Active Problem List   Diagnosis Date Noted  . Stroke (cerebrum) ([place]) 12/07/2017  . Stroke ([place]) 12/06/2017  . Chronic bilateral low back pain with bilateral sciatica 11/11/2017  . Lumbar facet arthropathy 11/11/2017  . Foraminal stenosis of lumbar region 11/11/2017  . Lumbar disc herniation 11/11/2017  . Chronic pain syndrome 11/11/2017  . Chronic diastolic heart failure ([place]) 07/14/2016  . Acute systolic CHF (congestive heart failure) ([place]) 06/27/2016  . AF (paroxysmal atrial fibrillation) ([place]) 06/27/2016  . Anxiety 06/27/2016  . HTN (hypertension) 06/27/2016  . HLD (hyperlipidemia) 06/27/2016    Past Surgical History:  Procedure Laterality Date  . ABDOMINAL HYSTERECTOMY    . CATARACT EXTRACTION W/PHACO  Right 07/31/2017   Procedure: CATARACT EXTRACTION PHACO AND INTRAOCULAR LENS PLACEMENT (IOC);  Surgeon: Birder Robson, MD;  Location: [facility] ORS;  Service: Ophthalmology;  Laterality: Right;  [place] 00:33.8 AP% 15.7 CDE 5.28 Fluid pack lot # [id number]  . CATARACT EXTRACTION W/PHACO Left 08/26/2017   Procedure: CATARACT EXTRACTION PHACO AND INTRAOCULAR LENS PLACEMENT (IOC);  Surgeon: Birder Robson, MD;  Location: [facility] ORS;  Service: Ophthalmology;  Laterality: Left;  [place] 00:38 AP% 15.3 CDE 5.95 Fluid pack lot # [id number]  . FRACTURE SURGERY    . HIP FRACTURE SURGERY    . JOINT REPLACEMENT    . REPLACEMENT TOTAL KNEE BILATERAL       OB History   None      Home Medications    Prior to Admission medications   Medication Sig Start Date End Date Taking? Authorizing Provider  Ascorbic Acid (VITAMIN C) 1000 MG tablet Take 1,000 mg by mouth daily.   Yes [provider]  CALCIUM PO Take 1 tablet by mouth daily.   Yes [provider]  Cholecalciferol (VITAMIN D3) 1000 units CAPS Take 1,000 Units by mouth daily.   Yes [provider]  clindamycin (CLEOCIN) 150 MG capsule Take 150 mg by mouth 4 (four) times daily. 7 day course filled 12/02/17 - for tooth infection 12/02/17  Yes [provider]  doxepin (SINEQUAN) 10 MG capsule Take 10-20 mg by mouth See admin instructions. Take one  tablet (10 mg) by mouth twice daily - 5am and 2pm, take 2 tablets (20 mg) at bedtime   Yes [provider]  furosemide (LASIX) 20 MG tablet Take 20 mg by mouth daily. 01/04/17  Yes [provider]  losartan (COZAAR) 25 MG tablet Take 25 mg by mouth daily.  05/18/16  Yes [provider]  metoprolol tartrate (LOPRESSOR) 25 MG tablet Take 1 tablet (25 mg total) by mouth 2 (two) times daily. Patient taking differently: Take 25 mg by mouth See admin instructions. Take one tablet (25 mg) by mouth twice daily - 5am and 2pm 06/28/16  Yes Mody, Sital, MD  Multiple  Vitamin (MULTIVITAMIN WITH MINERALS) TABS tablet Take 1 tablet by mouth daily.   Yes [provider]  Omega-3 Fatty Acids (FISH OIL OMEGA-3 PO) Take 1 capsule by mouth daily.   Yes [provider]  sotalol (BETAPACE) 80 MG tablet Take 1 tablet (80 mg total) by mouth daily. Patient taking differently: Take 80 mg by mouth See admin instructions. Take one tablet (80 mg) by mouth twice daily - 5am and 2pm 06/28/16  Yes Mody, Sital, MD  triamcinolone cream (KENALOG) 0.1 % Apply 1 application topically at bedtime. For psoriasis   Yes [provider]  warfarin (COUMADIN) 1 MG tablet Take 3 mg by mouth See admin instructions. Take three tablets (3 mg) by mouth at 2pm on Saturday and Sunday 04/20/16  Yes [provider]  warfarin (COUMADIN) 4 MG tablet Take 4 mg by mouth See admin instructions. Take one tablet (4 mg) by mouth at 2pm on Monday, Tuesday, Wednesday, Thursday, Friday 04/20/16  Yes [provider]  albuterol (PROVENTIL HFA;VENTOLIN HFA) 108 (90 Base) MCG/ACT inhaler Inhale 2 puffs into the lungs every 6 (six) hours as needed for wheezing or shortness of breath. Patient not taking: Reported on 12/06/2017 03/10/17   Arta Silence, MD  atorvastatin (LIPITOR) 40 MG tablet Take 1 tablet (40 mg total) by mouth daily at 6 PM. 12/07/17   Rinehuls, Early Chars, PA-C    Family History Family History  Problem Relation Age of Onset  . Heart failure Mother   . Asthma Father     Social History Social History   Tobacco Use  . Smoking status: Former Smoker    Packs/day: 0.25    Years: 20.00    Pack years: 5.00    Types: Cigarettes  . Smokeless tobacco: Never Used  . Tobacco comment: social  Substance Use Topics  . Alcohol use: No  . Drug use: No     Allergies   Patient has no known allergies.   Review of Systems Review of Systems 10 Systems reviewed and are negative for acute change except as noted in the HPI.  Physical Exam Updated Vital  Signs BP (!) 144/114   Pulse (!) 129   Temp (!) 97.5 F (36.4 C) (Oral)   Resp 20   SpO2 (!) 89%   Physical Exam  Constitutional: She appears well-developed and well-nourished. No distress.  HENT:  Head: Normocephalic and atraumatic.  Mouth/Throat: Oropharynx is clear and moist.  Eyes: EOM are normal.  Neck: Neck supple.  Cardiovascular: Normal rate, regular rhythm and intact distal pulses.  Pulmonary/Chest: Effort normal and breath sounds normal.  Abdominal: Soft. She exhibits no distension. There is no tenderness. There is no guarding.  Musculoskeletal: Normal range of motion. She exhibits no edema or tenderness.  Neurological: She is alert. No cranial nerve deficit or sensory deficit. She exhibits normal  muscle tone. Coordination normal.  Skin: Skin is warm and dry.  Psychiatric: She has a normal mood and affect.     ED Treatments / Results  Labs (all labs ordered are listed, but only abnormal results are displayed) Labs Reviewed  COMPREHENSIVE METABOLIC PANEL - Abnormal; Notable for the following components:      Result Value   Glucose, Bld 115 (*)    Creatinine, Ser 1.04 (*)    Total Protein 6.3 (*)    Total Bilirubin 1.5 (*)    GFR calc non Af Amer 47 (*)    GFR calc Af Amer 55 (*)    All other components within normal limits  LIPID PANEL - Abnormal; Notable for the following components:   Cholesterol 224 (*)    LDL Cholesterol 161 (*)    All other components within normal limits  CBG MONITORING, ED - Abnormal; Notable for the following components:   Glucose-Capillary 107 (*)    All other components within normal limits  I-STAT CHEM 8, ED - Abnormal; Notable for the following components:   Creatinine, Ser 1.20 (*)    Glucose, Bld 112 (*)    Calcium, Ion 1.13 (*)    Hemoglobin 11.9 (*)    HCT 35.0 (*)    All other components within normal limits  MRSA PCR SCREENING  PROTIME-INR  APTT  CBC  DIFFERENTIAL  HEMOGLOBIN A1C  I-STAT TROPONIN, ED    EKG EKG  Interpretation  Date/Time:  Saturday December 06 2017 16:01:37 EDT Ventricular Rate:  69 PR Interval:    QRS Duration: 137 QT Interval:  499 QTC Calculation: 535 R Axis:   151 Text Interpretation:  Sinus rhythm Prolonged PR interval RBBB and LPFB Borderline ST depression, lateral leads Old RBBB. no acute ischemic changes. no sig change from previojs Confirmed by Charlesetta Shanks [phone]) on 12/06/2017 5:42:17 PM   Radiology No results found.  Procedures Procedures (including critical care time)  CRITICAL CARE Performed by: Charlesetta Shanks   Total critical care time: 30 minutes  Critical care time was exclusive of separately billable procedures and treating other patients.  Critical care was necessary to treat or prevent imminent or life-threatening deterioration.  Critical care was time spent personally by me on the following activities: development of treatment plan with patient and/or surrogate as well as nursing, discussions with consultants, evaluation of patient's response to treatment, examination of patient, obtaining history from patient or surrogate, ordering and performing treatments and interventions, ordering and review of laboratory studies, ordering and review of radiographic studies, pulse oximetry and re-evaluation of patient's condition. Angiocath insertion Performed by: Charlesetta Shanks  Consent: Verbal consent obtained. Risks and benefits: risks, benefits and alternatives were discussed Time out: Immediately prior to procedure a "time out" was called to verify the correct patient, procedure, equipment, support staff and site/side marked as required.  Preparation: Patient was prepped and draped in the usual sterile fashion.  Vein Location: right median cubital/basilic  Ultrasound Guided yes  Gauge: 18  Normal blood return and flush without difficulty Patient tolerance: Patient tolerated the procedure well with no immediate complications.   Medications  Ordered in ED Medications  iopamidol (ISOVUE-370) 76 % injection (40 mLs  Contrast Given 12/06/17 1445)  iopamidol (ISOVUE-370) 76 % injection (50 mLs  Contrast Given 12/06/17 1500)   stroke: mapping our early stages of recovery book ( Does not apply Given 12/06/17 2234)   Consult: Dr. Malen Gauze neurology.  He advises patient does have study consistent with right MCA  clot.  Patient has normalized in terms of her neurologic exam.  She does take Coumadin but is subtherapeutic.  He requested IV ultrasound placement for 18-gauge to proceed with CT angiography.  I have seen the patient in the CT suite placing her IV.  She is alert and appropriate.  Her speech is clear with normal content.  No respiratory distress.  Normal airway.  Stable to proceed with additional diagnostic evaluation.   Initial Impression / Assessment and Plan / ED Course  I have reviewed the triage vital signs and the nursing notes.  Pertinent labs & imaging results that were available during my care of the patient were reviewed by me and considered in my medical decision making (see chart for details).    Patient did have distinct stroke symptoms that have spontaneously resolved.  Neurology has identified a CT angios consistent with right MCA clot.  However, as the patient's symptoms have resolved, the plan is for continued close observation.  Upon my last assessment in the emergency department, patient is alert and appropriate without focal deficit.  We did have long discussion of her previous findings upon arrival, her current condition and possible outcomes.  I did remind her that neurology had reviewed many of these things as well and would continue to watch her very closely for definitive management.  Final Clinical Impressions(s) / ED Diagnoses   Final diagnoses:  Stroke ([place])      AF (paroxysmal atrial fibrillation) ([place])               Charlesetta Shanks, MD 12/17/17 [phone]

## 2017-12-06 NOTE — Code Documentation (Signed)
84 year old female presents to [facility] via [place] as code stroke which was called in the field.  Per EMS patient was at a senior center having lunch when she was withnessed to loose consciousness with right gaze and left facial droop and aphasia.  On EMS arrival her sx were resolving.  She arrived to [facility] awake and alert - NIHSS 0 - no focal deficits noted although she does not remember the incident.  She later reported that she has been off her coumadin x 5 days for some dental work - just restarted yesterday.  CT and CTA scans were done - difficult IV start for CTP line - EDP was able to get line with the ultrasound. Dr. Lorraine Lax at bedside [facility] score remains 0 - see his note and reports for scan results.  No acute treatment at this time - for admit to ICU for close monitoring per MD order.

## 2017-12-06 NOTE — ED Notes (Signed)
Patient arrives to ER BIB [facility] from senior center where patient was at eating lunch. Patient reportedly became unconscious at 1:30 with resultant left facial droop, left weakness, aphasia, and right gaze. On arrival to ER patient symptoms have all resolved. A/o x4. VSS. NAD. Taken to CT.

## 2017-12-06 NOTE — ED Notes (Signed)
Received permission from MD [place] neuro, that patient is able to transport to MRI without RN accompanying. Patient NIH remains 0 as she transports to MRI with transporter

## 2017-12-06 NOTE — ED Notes (Signed)
Per 4N, patient may transfer to unit when she returns from MRI, regardless of time.

## 2017-12-07 DIAGNOSIS — I639 Cerebral infarction, unspecified: Secondary | ICD-10-CM | POA: Diagnosis present

## 2017-12-07 DIAGNOSIS — G459 Transient cerebral ischemic attack, unspecified: Secondary | ICD-10-CM | POA: Diagnosis not present

## 2017-12-07 LAB — LIPID PANEL
Cholesterol: 224 mg/dL — ABNORMAL HIGH (ref 0–200)
HDL: 43 mg/dL
LDL Cholesterol: 161 mg/dL — ABNORMAL HIGH (ref 0–99)
Total CHOL/HDL Ratio: 5.2 ratio
Triglycerides: 99 mg/dL
VLDL: 20 mg/dL (ref 0–40)

## 2017-12-07 MED ORDER — WARFARIN SODIUM 4 MG PO TABS: 4.0000 mg | ORAL_TABLET | ORAL | Status: DC

## 2017-12-07 MED ORDER — ATORVASTATIN CALCIUM 40 MG PO TABS: 40.0000 mg | ORAL_TABLET | Freq: Every day | ORAL | 11 refills | Status: AC

## 2017-12-07 MED ORDER — FUROSEMIDE 20 MG PO TABS
20.0000 mg | ORAL_TABLET | Freq: Every day | ORAL | Status: DC
  Administered 2017-12-07: 20 mg via ORAL
  Filled 2017-12-07: qty 1

## 2017-12-07 MED ORDER — ATORVASTATIN CALCIUM 40 MG PO TABS: 40.0000 mg | ORAL_TABLET | Freq: Every day | ORAL | Status: DC

## 2017-12-07 MED ORDER — WARFARIN SODIUM 3 MG PO TABS
3.0000 mg | ORAL_TABLET | ORAL | Status: DC
  Administered 2017-12-07: 3 mg via ORAL
  Filled 2017-12-07: qty 1

## 2017-12-07 MED ORDER — WARFARIN - PHYSICIAN DOSING INPATIENT: Freq: Every day | Status: DC

## 2017-12-07 MED ORDER — CLINDAMYCIN HCL 150 MG PO CAPS
150.0000 mg | ORAL_CAPSULE | Freq: Four times a day (QID) | ORAL | Status: DC
  Administered 2017-12-07: 150 mg via ORAL
  Filled 2017-12-07 (×4): qty 1

## 2017-12-07 MED ORDER — LOSARTAN POTASSIUM 50 MG PO TABS
25.0000 mg | ORAL_TABLET | Freq: Every day | ORAL | Status: DC
  Administered 2017-12-07: 25 mg via ORAL
  Filled 2017-12-07: qty 1

## 2017-12-07 MED ORDER — WARFARIN SODIUM 3 MG PO TABS: 3.0000 mg | ORAL_TABLET | ORAL | Status: DC

## 2017-12-07 MED ORDER — DOXEPIN HCL 10 MG PO CAPS: 10.0000 mg | ORAL_CAPSULE | ORAL | Status: DC

## 2017-12-07 NOTE — Progress Notes (Signed)
Active w [facility], orders renewed through PCP as outpatient per chart review. DC'd in Obs, no new orders needed. No other CM needs identified.

## 2017-12-07 NOTE — Evaluation (Signed)
Speech Language Pathology Evaluation Patient Details Name: Theresa Carrillo MRN: [id number] DOB: 1929-06-05 Today's Date: 12/07/2017 Time: 1950-9326 SLP Time Calculation (min) (ACUTE ONLY): 21 min  Problem List:  Patient Active Problem List   Diagnosis Date Noted  . Stroke ([place]) 12/06/2017  . Chronic bilateral low back pain with bilateral sciatica 11/11/2017  . Lumbar facet arthropathy 11/11/2017  . Foraminal stenosis of lumbar region 11/11/2017  . Lumbar disc herniation 11/11/2017  . Chronic pain syndrome 11/11/2017  . Chronic diastolic heart failure ([place]) 07/14/2016  . Acute systolic CHF (congestive heart failure) ([place]) 06/27/2016  . AF (paroxysmal atrial fibrillation) ([place]) 06/27/2016  . Anxiety 06/27/2016  . HTN (hypertension) 06/27/2016  . HLD (hyperlipidemia) 06/27/2016   Past Medical History:  Past Medical History:  Diagnosis Date  . Anxiety   . Arthritis   . Atrial fibrillation ([place])   . CHF (congestive heart failure) ([place])   . Chronic kidney disease    RENAL INSUFF  . Depression   . Dysrhythmia   . HLD (hyperlipidemia)   . Hypertension   . Mitral valve stenosis   . Stroke [facility])    Past Surgical History:  Past Surgical History:  Procedure Laterality Date  . ABDOMINAL HYSTERECTOMY    . CATARACT EXTRACTION W/PHACO Right 07/31/2017   Procedure: CATARACT EXTRACTION PHACO AND INTRAOCULAR LENS PLACEMENT (IOC);  Surgeon: Birder Robson, MD;  Location: [facility] ORS;  Service: Ophthalmology;  Laterality: Right;  [place] 00:33.8 AP% 15.7 CDE 5.28 Fluid pack lot # [id number]  . CATARACT EXTRACTION W/PHACO Left 08/26/2017   Procedure: CATARACT EXTRACTION PHACO AND INTRAOCULAR LENS PLACEMENT (IOC);  Surgeon: Birder Robson, MD;  Location: [facility] ORS;  Service: Ophthalmology;  Laterality: Left;  [place] 00:38 AP% 15.3 CDE 5.95 Fluid pack lot # [id number]  . FRACTURE SURGERY    . HIP FRACTURE SURGERY    . JOINT REPLACEMENT    . REPLACEMENT TOTAL KNEE BILATERAL     HPI:  Theresa Carrillo is an 84 y.o. female  With PMH CVA ( 2013 ), HTN, HLD, CHF, a. Fib ( coumadin) who presented to [facility] ED as a code stroke for right gaze, facial droop, unresponsiveness.   Assessment / Plan / Recommendation Clinical Impression   Patient presents with mild cognitive impairments which are reportedly her baseline. Pt, daughter endorse difficulties with memory. SLP administered MOCA form 7.1. Pt scored 21/30 (26 and above is considered within normal limits). Deficits are noted in executive function, immediate and delayed recall, and working memory. Pt does have intellectual, emergent awareness of deficits. Reportedly, she uses a pill box for vitamins only, not for her other medications. SLP educated based on findings of today's assessment that pt may benefit from using pill box for all medications, due to functional memory impairments. Daughters present and report they have encouraged pt to do so in the past. Daughters confirm pt deficits are consistent with her baseline. Pt does not do any cooking at home, as meals are provided by her facility. No further skilled ST follow-up warranted. Education completed and SLP will s/o.     SLP Assessment  SLP Recommendation/Assessment: Patient does not need any further Speech Lanaguage Pathology Services SLP Visit Diagnosis: Cognitive communication deficit (R41.841)    Follow Up Recommendations  Other (comment)(Independent living)    Frequency and Duration           SLP Evaluation Cognition  Overall Cognitive Status: History of cognitive impairments - at baseline Arousal/Alertness: Awake/alert Orientation Level: Oriented X4 Attention: Selective Selective  Attention: Appears intact Memory: Impaired Memory Impairment: Other (comment);Decreased short term memory(working memory) Decreased Short Term Memory: Verbal basic;Functional basic Awareness: Appears intact Problem Solving: Appears intact Executive Function: Organizing Organizing:  Impaired Organizing Impairment: Functional basic(clock drawing impaired) Safety/Judgment: Appears intact       Comprehension  Auditory Comprehension Overall Auditory Comprehension: Appears within functional limits for tasks assessed Yes/No Questions: Within Functional Limits Commands: Within Functional Limits Conversation: Other (comment)(mod complex) Visual Recognition/Discrimination Discrimination: Within Function Limits Reading Comprehension Reading Status: Not tested    Expression Expression Primary Mode of Expression: Verbal Verbal Expression Overall Verbal Expression: Appears within functional limits for tasks assessed Initiation: No impairment Repetition: No impairment Naming: No impairment Pragmatics: No impairment Written Expression Dominant Hand: Left Written Expression: Not tested   Oral / Motor  Oral Motor/Sensory Function Overall Oral Motor/Sensory Function: Within functional limits Motor Speech Overall Motor Speech: Appears within functional limits for tasks assessed Respiration: Within functional limits Phonation: Normal Resonance: Within functional limits Articulation: Within functional limitis Intelligibility: Intelligible   GO                   Deneise Lever, [place], [place] Pager: [phone] Office: [phone]  Aliene Altes 12/07/2017, 1:48 PM

## 2017-12-07 NOTE — Care Management CC44 (Signed)
Condition Code 44 Documentation Completed  Patient Details  Name: Theresa Carrillo MRN: [id number] Date of Birth: [birth year]   Condition Code 44 given:  Yes Patient signature on Condition Code 44 notice:  Yes Documentation of 2 MD's agreement:  Yes Code 44 added to claim:  Yes    Carles Collet, RN 12/07/2017, 3:47 PM

## 2017-12-07 NOTE — Progress Notes (Signed)
Patient Theresa Carrillo home via car with daughters.  DC instructions and prescription information given and both fully understood.  Vital signs and assessments were stable.

## 2017-12-07 NOTE — Discharge Instructions (Signed)
1. Have a Pro time blood test within one week to check your coumadin level.  2. You have a mildly enlarged aorta. This should be checked on a yearly basis. Your family doctor can help arrange these evaluations 3. Increase your activity gradually as tolerated.

## 2017-12-07 NOTE — Care Management Obs Status (Signed)
[place] NOTIFICATION   Patient Details  Name: Theresa Carrillo MRN: [id number] Date of Birth: October 09, 1929   Medicare Observation Status Notification Given:  Yes    Carles Collet, RN 12/07/2017, 3:47 PM

## 2017-12-07 NOTE — Discharge Summary (Signed)
Stroke Discharge Summary  Patient ID: Theresa Carrillo   MRN: [id number]      DOB: 1929/11/27  Date of Admission: 12/06/2017 Date of Discharge: 12/07/2017  Attending Physician:  Garvin Fila, MD, Stroke MD Consultant(s):    None  Patient's PCP:  Theresa Axe, MD   Interval History: Her daughtres are at bedside. She was off coumadin for 5 days for teeth procedure. She ha afib. Suddenly went unresponsive, left side was weak, facial droop. Restarted coumadin the day before presentation. INR subtharapeutic Exam non focal.  Discussed MRI negative for acute stroke. She has a hyperdense MCA sign but no penumbra. Likely a TIA and the MCA is a chronic finding given collaterals already in place however cannot be entirely sure. Patient is not therapeutic on Coumadin, discussed there are risks that MCA stenosis is new clot and risk of stroke. Family insist on going home today, understand risks, do to want to stay and want to leave asap. Discussed with Dr. Lorraine Carrillo who also believe dense MCA is chronic but again, cannot be entirely sure. They will be discharged.   DISCHARGE DIAGNOSIS:  Possible TIA: embolic secondary to afib with subtherapeutic warfarin level. Active Problems:   Stroke [facility])   Past Medical History:  Diagnosis Date  . Anxiety   . Arthritis   . Atrial fibrillation ([place])   . CHF (congestive heart failure) ([place])   . Chronic kidney disease    RENAL INSUFF  . Depression   . Dysrhythmia   . HLD (hyperlipidemia)   . Hypertension   . Mitral valve stenosis   . Stroke [facility])    Past Surgical History:  Procedure Laterality Date  . ABDOMINAL HYSTERECTOMY    . CATARACT EXTRACTION W/PHACO Right 07/31/2017   Procedure: CATARACT EXTRACTION PHACO AND INTRAOCULAR LENS PLACEMENT (IOC);  Surgeon: Birder Robson, MD;  Location: [facility] ORS;  Service: Ophthalmology;  Laterality: Right;  [place] 00:33.8 AP% 15.7 CDE 5.28 Fluid pack lot # [id number]  . CATARACT EXTRACTION W/PHACO Left 08/26/2017    Procedure: CATARACT EXTRACTION PHACO AND INTRAOCULAR LENS PLACEMENT (IOC);  Surgeon: Birder Robson, MD;  Location: [facility] ORS;  Service: Ophthalmology;  Laterality: Left;  [place] 00:38 AP% 15.3 CDE 5.95 Fluid pack lot # [id number]  . FRACTURE SURGERY    . HIP FRACTURE SURGERY    . JOINT REPLACEMENT    . REPLACEMENT TOTAL KNEE BILATERAL      Allergies as of 12/07/2017   No Known Allergies     Medication List    STOP taking these medications   PRESCRIPTION MEDICATION     TAKE these medications   albuterol 108 (90 Base) MCG/ACT inhaler Commonly known as:  PROVENTIL HFA;VENTOLIN HFA Inhale 2 puffs into the lungs every 6 (six) hours as needed for wheezing or shortness of breath.   atorvastatin 40 MG tablet Commonly known as:  LIPITOR Take 1 tablet (40 mg total) by mouth daily at 6 PM.   CALCIUM PO Take 1 tablet by mouth daily.   clindamycin 150 MG capsule Commonly known as:  CLEOCIN Take 150 mg by mouth 4 (four) times daily. 7 day course filled 12/02/17 - for tooth infection   doxepin 10 MG capsule Commonly known as:  SINEQUAN Take 10-20 mg by mouth See admin instructions. Take one tablet (10 mg) by mouth twice daily - 5am and 2pm, take 2 tablets (20 mg) at bedtime   FISH OIL OMEGA-3 PO Take 1 capsule by mouth daily. What changed:  Another  medication with the same name was removed. Continue taking this medication, and follow the directions you see here.   furosemide 20 MG tablet Commonly known as:  LASIX Take 20 mg by mouth daily.   losartan 25 MG tablet Commonly known as:  COZAAR Take 25 mg by mouth daily.   metoprolol tartrate 25 MG tablet Commonly known as:  LOPRESSOR Take 1 tablet (25 mg total) by mouth 2 (two) times daily. What changed:    when to take this  additional instructions   multivitamin with minerals Tabs tablet Take 1 tablet by mouth daily.   sotalol 80 MG tablet Commonly known as:  BETAPACE Take 1 tablet (80 mg total) by mouth daily. What  changed:    when to take this  additional instructions   triamcinolone cream 0.1 % Commonly known as:  KENALOG Apply 1 application topically at bedtime. For psoriasis   vitamin C 1000 MG tablet Take 1,000 mg by mouth daily.   Vitamin D3 1000 units Caps Take 1,000 Units by mouth daily.   warfarin 1 MG tablet Commonly known as:  COUMADIN Take 3 mg by mouth See admin instructions. Take three tablets (3 mg) by mouth at 2pm on Saturday and Sunday   warfarin 4 MG tablet Commonly known as:  COUMADIN Take 4 mg by mouth See admin instructions. Take one tablet (4 mg) by mouth at 2pm on Monday, Tuesday, Wednesday, Thursday, Friday       LABORATORY STUDIES CBC    Component Value Date/Time   WBC 6.8 12/06/2017 1428   RBC 4.21 12/06/2017 1428   HGB 11.9 (L) 12/06/2017 1431   HGB 12.1 06/09/2013 0553   HCT 35.0 (L) 12/06/2017 1431   HCT 35.5 06/09/2013 0553   PLT 220 12/06/2017 1428   PLT 203 06/09/2013 0553   MCV 89.8 12/06/2017 1428   MCV 87 06/09/2013 0553   MCH 28.7 12/06/2017 1428   MCHC 32.0 12/06/2017 1428   RDW 13.4 12/06/2017 1428   RDW 13.6 06/09/2013 0553   LYMPHSABS 0.9 12/06/2017 1428   LYMPHSABS 1.0 06/09/2013 0553   MONOABS 0.7 12/06/2017 1428   MONOABS 0.7 06/09/2013 0553   EOSABS 0.5 12/06/2017 1428   EOSABS 0.2 06/09/2013 0553   BASOSABS 0.1 12/06/2017 1428   BASOSABS 0.1 06/09/2013 0553   CMP    Component Value Date/Time   NA 139 12/06/2017 1431   NA 139 06/09/2013 0553   K 3.8 12/06/2017 1431   K 4.1 06/09/2013 0553   CL 101 12/06/2017 1431   CL 107 06/09/2013 0553   CO2 28 12/06/2017 1428   CO2 23 06/09/2013 0553   GLUCOSE 112 (H) 12/06/2017 1431   GLUCOSE 111 (H) 06/09/2013 0553   BUN 19 12/06/2017 1431   BUN 25 (H) 06/09/2013 0553   CREATININE 1.20 (H) 12/06/2017 1431   CREATININE 0.96 06/09/2013 0553   CALCIUM 9.4 12/06/2017 1428   CALCIUM 8.6 06/09/2013 0553   PROT 6.3 (L) 12/06/2017 1428   PROT 7.3 06/08/2013 1112   ALBUMIN 4.0  12/06/2017 1428   ALBUMIN 3.9 06/08/2013 1112   AST 23 12/06/2017 1428   AST 37 06/08/2013 1112   ALT 21 12/06/2017 1428   ALT 28 06/08/2013 1112   ALKPHOS 53 12/06/2017 1428   ALKPHOS 68 06/08/2013 1112   BILITOT 1.5 (H) 12/06/2017 1428   BILITOT 1.1 (H) 06/08/2013 1112   GFRNONAA 47 (L) 12/06/2017 1428   GFRNONAA 55 (L) 06/09/2013 0553   GFRAA 55 (L) 12/06/2017 1428  GFRAA >60 06/09/2013 0553   COAGS Lab Results  Component Value Date   INR 1.10 12/06/2017   INR 2.24 06/28/2016   INR 1.83 06/26/2016   Lipid Panel    Component Value Date/Time   CHOL 224 (H) 12/07/2017 0220   TRIG 99 12/07/2017 0220   HDL 43 12/07/2017 0220   CHOLHDL 5.2 12/07/2017 0220   VLDL 20 12/07/2017 0220   LDLCALC 161 (H) 12/07/2017 0220   HgbA1C  Lab Results  Component Value Date   HGBA1C 5.4 12/06/2017   Urinalysis    Component Value Date/Time   COLORURINE YELLOW (A) 03/18/2017 1845   APPEARANCEUR CLOUDY (A) 03/18/2017 1845   APPEARANCEUR Hazy 06/08/2013 1334   LABSPEC 1.016 03/18/2017 1845   LABSPEC 1.018 06/08/2013 1334   PHURINE 5.0 03/18/2017 1845   GLUCOSEU NEGATIVE 03/18/2017 1845   GLUCOSEU Negative 06/08/2013 1334   HGBUR NEGATIVE 03/18/2017 1845   BILIRUBINUR NEGATIVE 03/18/2017 1845   BILIRUBINUR Negative 06/08/2013 1334   KETONESUR 5 (A) 03/18/2017 1845   PROTEINUR 30 (A) 03/18/2017 1845   NITRITE NEGATIVE 03/18/2017 1845   LEUKOCYTESUR LARGE (A) 03/18/2017 1845   LEUKOCYTESUR 2+ 06/08/2013 1334   Urine Drug Screen No results found for: LABOPIA, COCAINSCRNUR, LABBENZ, AMPHETMU, THCU, LABBARB  Alcohol Level No results found for: [facility]   SIGNIFICANT DIAGNOSTIC STUDIES  Ct Angio Head W Or Wo Contrast Ct Angio Neck W Or Wo Contrast Ct Cerebral Perfusion W Contrast 12/06/2017 IMPRESSION:  1. Positive for large vessel occlusion at the distal Right M1. CTA suggests intermediate level collaterals, while CTP detects no core infarct but 26 mL of penumbra in the posterior  right MCA territory. This was discussed by telephone with Dr. Lorraine Carrillo on 12/06/2017 at 1550 hours.  2. Mild for age anterior and posterior circulation atherosclerosis otherwise, with no other hemodynamically significant stenosis.  3. There is 50% stenosis of the Right Subclavian Artery origin due to plaque.  4. Aortic Atherosclerosis (ICD10-I70.0) with ectatic to borderline aneurysmal ascending thoracic aorta and distal arch. Recommend annual imaging followup by Chest CTA or MRA.    Dg Chest 1 View 12/06/2017 IMPRESSION:  No acute cardiopulmonary disease.    Mr Brain Wo Contrast 12/06/2017 IMPRESSION:  1. No acute intracranial abnormality.  2. Chronic small vessel ischemia and generalized atrophy.    Ct Head Code Stroke Wo Contrast 12/06/2017 IMPRESSION:  1. Positive for hyperdense right MCA, but no CT changes of acute right MCA infarct and no acute hemorrhage. ASPECTS is 10.  2. Chronic small vessel disease in both hemispheres appears stable since 2018.   Interval History 12/07/2017: Her daughtres are at bedside. She was off coumadin for 5 days for teeth procedure. She ha afib. Suddenly went unresponsive, left side was weak, facial droop. Restarted coumadin the day before presentation. INR subtharapeutic Exam non focal.  Discussed MRI negative for acute stroke. She has a hyperdense MCA sign but no penumbra. Likely a TIA and the MCA is a chronic finding given collaterals already in place however cannot be entirely sure. Patient is not therapeutic on Coumadin, discussed there are risks that MCA stenosis is new clot and risk of stroke. Family insist on going home today, understand risks, do to want to stay and want to leave asap. Discussed with Dr. Lorraine Carrillo who also believe dense MCA is chronic but again, cannot be entirely sure. They will be discharged.      HISTORY OF PRESENT ILLNESS Theresa Carrillo is an 84 y.o. female  With PMH CVA (  2013 ), HTN, HLD, CHF, a. Fib ( coumadin) who  presented to [facility] ED as a code stroke for right gaze, facial droop, unresponsiveness.  Patient was eating lunch at a senior center and suddenly went unresponsive, left side was weak, right side gaze, facial droop. Patient does not remember anything that happened. She insist that " she is fine, she is going home, and EMS kidnapped her". Denies any vision changes, HA, CP, SOB or any weakness. Patient states that her last dose of coumadin was yesterday and she was headed home to take her dose today due at 1400. Per daughter patient recently had two teeth removed and has bot been taking her coumadin. She re-started her coumadin yesterday. This is why her INR  is sub-therapeutic.  ED course:  Ct head: no hemorrhage; hyperdense right MCA CTA: distal right M1 occlusion  BP:170/77 BG:112 Creatinine: 1.20 , INR: 1.10  12/14/2011 stroke seen on MRI. Per patient she had a "silent" stroke many years before that. No residual deficits  Date last known well: Date: 12/06/2017 Time last known well: Time: 13:30 tPA Given: No: on coumadin Modified Rankin: Rankin Score=1 NIHSS:0  PHYSICAL EXAM Physical exam: Exam: Gen: NAD Eyes: anicteric sclerae, moist conjunctivae                    CV: no MRG, no carotid bruits, no peripheral edema Mental Status: Alert, follows commands, good historian  Neuro: Detailed Neurologic Exam  Speech:    No aphasia, no dysarthria  Cranial Nerves:    The pupils are equal, round, and reactive to light.. Attempted, Fundi not visualized.  EOMI. No gaze preference. Visual fields full. Face symmetric, Tongue midline. Hearing intact to voice. Shoulder shrug intact  Motor Observation:    no involuntary movements noted. Tone appears normal.     Strength:    5/5     Sensation:  Intact to LT  Plantars downgoing.   HOSPITAL COURSE ASSESSMENT/PLAN Theresa Carrillo is a 84 y.o. female with history of previous strokes, hypertension, hyperlipidemia, atrial  fibrillation, (on coumadin but held for 5 days for tooth extraction) chronic kidney disease, congestive heart failure, and anxiety presenting with right gaze, facial droop, unresponsiveness.  She did not receive IV t-PA due to improving deficits and coumadin therapy.  TIA: embolic secondary to afib with subtherapeutic warfarin level. Dense MCI sign may be chronic due to collaterals, patient and daughter want to be discharged today discussed risk of stroke given possibility the dense mca is acute and they understand risks.  Resultant  - back to base line  Carotid Doppler - CTA neck performed or pending - carotid dopplers not indicated.  LDL - 161 -> start lipitor 40 mg daily  HgbA1c - 5.4  warfarin daily prior to admission, now on warfarin daily  Patient counseled to be compliant with her antithrombotic medications  Ongoing aggressive stroke risk factor management  Therapy recommendations:  no focal deficits  Disposition:  Discharge to home  Hypertension  Stable   Hyperlipidemia  Lipid lowering medication PTA:  none  LDL 161, goal < 70  Current lipid lowering medication: add Lipitor 40 mg daily  Continue statin at discharge  The patient will be started on a statin while in hospital. If patient experiences myalgias, discontinue and schedule patient for Repatha or Praluent on an outpatient basis.   Other Stroke Risk Factors  Advanced age  Former cigarette smoker - quit  Obesity, There is no height or weight on file  to calculate BMI., recommend weight loss, diet and exercise as appropriate   Hx stroke/TIA  Atrial Fibrillation off of her warfarn  Other Active Problems  CKD   DISCHARGE EXAM Vitals:   12/07/17 0800 12/07/17 0900 12/07/17 1000 12/07/17 1200  BP: (!) 122/91 133/62 135/80   Pulse: (!) 105 (!) 115 (!) 114   Resp: (!) 27 (!) 26 (!) 21   Temp: 98.1 F (36.7 C)   (!) 97.5 F (36.4 C)  TempSrc: Oral   Oral  SpO2: 94% 94% 95%     Discharge  Diet    Diet Order            Diet heart healthy/carb modified Room service appropriate? Yes; Fluid consistency: Thin  Diet effective now             liquids  DISCHARGE PLAN  Disposition:  Discharge to home  warfarin daily for secondary stroke prevention.  Ongoing risk factor control by Primary Care Physician at time of discharge  Follow-up Theresa Axe, MD in 2 weeks.  Follow-up in [facility] in 4 weeks, office to schedule an appointment.   Early follow up for INR check - within 1 week  Aortic Atherosclerosis (ICD10-I70.0) with ectatic to borderline aneurysmal ascending thoracic aorta and distal arch. Recommend annual imaging followup by Chest CTA or MRA per radiology.  40 minutes were spent preparing discharge.  Mikey Bussing PA-C [facility] Pager [phone] 12/07/2017, 2:07 PM

## 2017-12-07 NOTE — Evaluation (Signed)
Physical Therapy Evaluation Patient Details Name: Theresa Carrillo MRN: [id number] DOB: 08/25/1929 Today's Date: 12/07/2017   History of Present Illness  Theresa Carrillo is an 84 y.o. female  With PMH CVA ( 2013 ), HTN, HLD, CHF, a. Fib ( coumadin) who presented to [facility] ED as a code stroke for right gaze, facial droop, unresponsiveness.  Pt had gone off coumadin recently for a dental procedure and then had restarted it.  CT head showed a hyperdense right MCA and CT angiogram confirmed a right M1 occlusion.  Clinical Impression  Pt admitted with above diagnosis. Pt currently with functional limitations due to the deficits listed below (see PT Problem List). Pt was able to ambulate with RW with overall good safety.  Pt reports she is a little weaker than prior to admit but she can have meals brought to her a few days until she is able to go to dining room.  Should progress well and return to I living.   Pt will benefit from skilled PT to increase their independence and safety with mobility to allow discharge to the venue listed below.      Follow Up Recommendations Home health PT;Supervision - Intermittent    Equipment Recommendations  None recommended by PT    Recommendations for Other Services       Precautions / Restrictions Precautions Precautions: Fall Restrictions Weight Bearing Restrictions: No      Mobility  Bed Mobility Overal bed mobility: Independent                Transfers Overall transfer level: Needs assistance Equipment used: Rolling walker (2 wheeled) Transfers: Sit to/from Stand Sit to Stand: Supervision            Ambulation/Gait Ambulation/Gait assistance: Min guard Gait Distance (Feet): 120 Feet Assistive device: Rolling walker (2 wheeled) Gait Pattern/deviations: Step-through pattern;Decreased stride length;Trunk flexed   Gait velocity interpretation: <1.31 ft/sec, indicative of household ambulator General Gait Details: Pt was able to  ambulate with RW with cues and min guard assist.  Cues to stay close to RW only as she lets RW get too far ahead at times.   Stairs            Wheelchair Mobility    Modified Rankin (Stroke Patients Only) Modified Rankin (Stroke Patients Only) Pre-Morbid Rankin Score: No symptoms Modified Rankin: Moderately severe disability     Balance Overall balance assessment: Needs assistance Sitting-balance support: No upper extremity supported;Feet supported Sitting balance-Leahy Scale: Fair     Standing balance support: Bilateral upper extremity supported;During functional activity Standing balance-Leahy Scale: Poor Standing balance comment: relies on RW for support                             Pertinent Vitals/Pain Pain Assessment: No/denies pain    Home Living Family/patient expects to be discharged to:: Private residence Living Arrangements: Alone Available Help at Discharge: Other (Comment)(facility cleans and provides meals) Type of Home: Independent living facility([facility]) Home Access: Level entry     Home Layout: One level Home Equipment: Walker - 4 wheels;Shower seat;Grab bars - tub/shower;Toilet riser;Hand held shower head;Adaptive equipment      Prior Function Level of Independence: Independent with assistive device(s)         Comments: Mod indep with ADLs, household mobility with 4WRW; ambulates to/from dining hall for 2 meals/day.  Denies fall history.     Hand Dominance   Dominant Hand: Left  Extremity/Trunk Assessment   Upper Extremity Assessment Upper Extremity Assessment: Defer to OT evaluation    Lower Extremity Assessment Lower Extremity Assessment: Generalized weakness    Cervical / Trunk Assessment Cervical / Trunk Assessment: Kyphotic  Communication   Communication: No difficulties  Cognition Arousal/Alertness: Awake/alert Behavior During Therapy: WFL for tasks assessed/performed Overall Cognitive Status: History  of cognitive impairments - at baseline                                        General Comments      Exercises General Exercises - Lower Extremity Ankle Circles/Pumps: AROM;Both;10 reps;Seated Long Arc Quad: AROM;Both;10 reps;Seated Hip ABduction/ADduction: AROM;Both;10 reps;Seated Hip Flexion/Marching: AROM;Both;10 reps;Seated   Assessment/Plan    PT Assessment Patient needs continued PT services  PT Problem List Decreased activity tolerance;Decreased balance;Decreased mobility;Decreased knowledge of use of DME;Decreased safety awareness;Decreased knowledge of precautions       PT Treatment Interventions DME instruction;Gait training;Functional mobility training;Therapeutic activities;Therapeutic exercise;Balance training;Patient/family education;Cognitive remediation    PT Goals (Current goals can be found in the Care Plan section)  Acute Rehab PT Goals Patient Stated Goal: to go home to I living PT Goal Formulation: With patient Time For Goal Achievement: 12/21/17 Potential to Achieve Goals: Good    Frequency Min 4X/week   Barriers to discharge Decreased caregiver support      Co-evaluation               AM-PAC PT "6 Clicks" Daily Activity  Outcome Measure Difficulty turning over in bed (including adjusting bedclothes, sheets and blankets)?: None Difficulty moving from lying on back to sitting on the side of the bed? : None Difficulty sitting down on and standing up from a chair with arms (e.g., wheelchair, bedside commode, etc,.)?: A Little Help needed moving to and from a bed to chair (including a wheelchair)?: A Little Help needed walking in hospital room?: A Little Help needed climbing 3-5 steps with a railing? : A Lot 6 Click Score: 19    End of Session Equipment Utilized During Treatment: Gait belt Activity Tolerance: Patient limited by fatigue Patient left: in chair;with call bell/phone within reach;with chair alarm set;with  family/visitor present Nurse Communication: Mobility status PT Visit Diagnosis: Unsteadiness on feet (R26.81);Muscle weakness (generalized) (M62.81)    Time: 0240-9735 PT Time Calculation (min) (ACUTE ONLY): 27 min   Charges:   PT Evaluation $PT Eval Moderate Complexity: 1 Mod PT Treatments $Gait Training: 8-22 mins        [place] Pager:  [phone]  Office:  [place] 12/07/2017, 2:35 PM

## 2018-02-02 ENCOUNTER — Ambulatory Visit: Payer: Medicare Other | Admitting: Adult Health

## 2018-02-06 ENCOUNTER — Encounter: Payer: Self-pay | Admitting: Adult Health

## 2018-02-06 ENCOUNTER — Ambulatory Visit (INDEPENDENT_AMBULATORY_CARE_PROVIDER_SITE_OTHER): Payer: Medicare Other | Admitting: Adult Health

## 2018-02-06 VITALS — BP 126/75 | HR 79 | Ht 60.0 in | Wt 141.8 lb

## 2018-02-06 DIAGNOSIS — G459 Transient cerebral ischemic attack, unspecified: Secondary | ICD-10-CM

## 2018-02-06 DIAGNOSIS — E785 Hyperlipidemia, unspecified: Secondary | ICD-10-CM | POA: Diagnosis not present

## 2018-02-06 DIAGNOSIS — I10 Essential (primary) hypertension: Secondary | ICD-10-CM

## 2018-02-06 DIAGNOSIS — I480 Paroxysmal atrial fibrillation: Secondary | ICD-10-CM

## 2018-02-06 NOTE — Patient Instructions (Signed)
Continue warfarin daily  and Lipitor for secondary stroke prevention  Continue to follow up with PCP/cardiologist regarding cholesterol and blood pressure management   Continue to follow with your cardiologist in regards to atrial fibrillation and Coumadin management  Continue to stay active and maintain a healthy diet  Continue to monitor blood pressure at home  Maintain strict control of hypertension with blood pressure goal below 130/90, diabetes with hemoglobin A1c goal below 6.5% and cholesterol with LDL cholesterol (bad cholesterol) goal below 70 mg/dL. I also advised the patient to eat a healthy diet with plenty of whole grains, cereals, fruits and vegetables, exercise regularly and maintain ideal body weight.  Followup in the future with me in 3 months or call earlier if needed       Thank you for coming to see [place] at [facility]. I hope we have been able to provide you high quality care today.  You may receive a patient satisfaction survey over the next few weeks. We would appreciate your feedback and comments so that we may continue to improve ourselves and the health of our patients.

## 2018-02-06 NOTE — Progress Notes (Signed)
[facility] [address] [place]. [place] [address] [phone]       OFFICE FOLLOW UP NOTE  Ms. Theresa Carrillo Date of Birth:  1929/06/13 Medical Record Number:  [id number]   Reason for Referral:  hospital stroke follow up  CHIEF COMPLAINT:  Chief Complaint  Patient presents with  . Hospitalization Follow-up    No new concerns at this time. Daugher present. Treatment room.     HPI: Theresa Carrillo is being seen today for initial visit in the office for TIA secondary to atrial fibrillation with subtherapeutic INR off Coumadin therapy 5 days prior to to procedure on 12/06/2017. History obtained from patient, daughter and chart review. Reviewed all radiology images and labs personally.  Theresa M Rooseveltis a 84 y.o.femalewith history of previous strokes, hypertension, hyperlipidemia, atrial fibrillation, (on coumadin but held for 5 days for tooth extraction) chronic kidney disease, congestive heart failure, and anxiety  who presented with right gaze, facial droop, unresponsiveness.  Shedid not receive IV t-PA due to improving deficits and coumadin therapy.  CT head reviewed and showed hyperdense right MCA but no CT changes of acute right MCA infarct.  CTA head/neck showed large vessel occlusion at the right distal right M1.  CTP detects no core infarct 26 mL of penumbra in the posterior right MCA territory.  CTA also showed mild for age anterior and posterior circulation arthrosclerosis but otherwise no other hemodynamically significant stenosis, 50% stenosis of the right subclavian artery origin due to plaque and aortic arthrosclerosis.  MRI brain reviewed and did not show acute abnormality but did show chronic small vessel ischemia and generalized atrophy. LDL 161 and recommended initiating atorvastatin 40 mg daily.  Recommended to continue on warfarin for atrial fibrillation and secondary stroke prevention.  It was felt as though patient did have TIA embolic  secondary to subtherapeutic INR while she was off Coumadin for 5 days prior to to procedure.  Findings of hyperdense MCA most likely chronic.  It was determined as MCA stenosis likely a new clot and increased risk for recurrent stroke.  Per ED notes, family insisted on being discharged despite risks.  Patient was discharged home without therapy needs as she had returned back to baseline.  Patient is being seen today for hospital follow-up and is accompanied by her daughter.  Overall, patient has been doing well without recurring symptoms. She has recently completed home therapy. She does ambulate with rollator walker which she used prior to this event. She lives at an independent living facility. She is able to maintain all ADLs independently. She has continued on Coumadin without side effects of bleeding or bruising with elevated INR levels but did have coumadin dosage change.  Continues on atorvastatin without side effects myalgias.  Blood pressure today satisfactory 126/75. She does not monitor at home but plans on obtaining a cuff. Daughter does bring up that patient has had double vision in her periphery on left side which has been present for 2 years and patient notices after her cataract surgery. She was told by her eye doctor that this was neurological. This is not bothersome for patient and she has no concerns. No further concerns at this time.  Denies new or worsening stroke/TIA symptoms.   ROS:   14 system review of systems performed and negative with exception of fatigue, chest pain, swelling in legs, hearing loss, double vision, shortness of breath, incontinence, aching muscles, easy bruising, memory loss, depression, anxiety, decreased energy, disinterested activities and sleepiness  PMH:  Past Medical History:  Diagnosis Date  . Anxiety   . Arthritis   . Atrial fibrillation ([place])   . CHF (congestive heart failure) ([place])   . Chronic kidney disease    RENAL INSUFF  . Depression   .  Dysrhythmia   . HLD (hyperlipidemia)   . Hypertension   . Mitral valve stenosis   . Stroke [facility])     PSH:  Past Surgical History:  Procedure Laterality Date  . ABDOMINAL HYSTERECTOMY    . CATARACT EXTRACTION W/PHACO Right 07/31/2017   Procedure: CATARACT EXTRACTION PHACO AND INTRAOCULAR LENS PLACEMENT (IOC);  Surgeon: Birder Robson, MD;  Location: [facility] ORS;  Service: Ophthalmology;  Laterality: Right;  [place] 00:33.8 AP% 15.7 CDE 5.28 Fluid pack lot # [id number]  . CATARACT EXTRACTION W/PHACO Left 08/26/2017   Procedure: CATARACT EXTRACTION PHACO AND INTRAOCULAR LENS PLACEMENT (IOC);  Surgeon: Birder Robson, MD;  Location: [facility] ORS;  Service: Ophthalmology;  Laterality: Left;  [place] 00:38 AP% 15.3 CDE 5.95 Fluid pack lot # [id number]  . FRACTURE SURGERY    . HIP FRACTURE SURGERY    . JOINT REPLACEMENT    . REPLACEMENT TOTAL KNEE BILATERAL      Social History:  Social History   Socioeconomic History  . Marital status: Widowed    Spouse name: Not on file  . Number of children: Not on file  . Years of education: Not on file  . Highest education level: Not on file  Occupational History  . Not on file  Social Needs  . Financial resource strain: Not on file  . Food insecurity:    Worry: Not on file    Inability: Not on file  . Transportation needs:    Medical: Not on file    Non-medical: Not on file  Tobacco Use  . Smoking status: Former Smoker    Packs/day: 0.25    Years: 20.00    Pack years: 5.00    Types: Cigarettes  . Smokeless tobacco: Never Used  . Tobacco comment: social  Substance and Sexual Activity  . Alcohol use: No  . Drug use: No  . Sexual activity: Not on file  Lifestyle  . Physical activity:    Days per week: Not on file    Minutes per session: Not on file  . Stress: Not on file  Relationships  . Social connections:    Talks on phone: Not on file    Gets together: Not on file    Attends religious service: Not on file    Active member of club or  organization: Not on file    Attends meetings of clubs or organizations: Not on file    Relationship status: Not on file  . Intimate partner violence:    Fear of current or ex partner: Not on file    Emotionally abused: Not on file    Physically abused: Not on file    Forced sexual activity: Not on file  Other Topics Concern  . Not on file  Social History Narrative  . Not on file    Family History:  Family History  Problem Relation Age of Onset  . Heart failure Mother   . Asthma Father     Medications:   Current Outpatient Medications on File Prior to Visit  Medication Sig Dispense Refill  . Ascorbic Acid (VITAMIN C) 1000 MG tablet Take 1,000 mg by mouth daily.    Marland Kitchen atorvastatin (LIPITOR) 40 MG tablet Take 1 tablet (40 mg total) by mouth  daily at 6 PM. 30 tablet 11  . CALCIUM PO Take 1 tablet by mouth daily.    . Cholecalciferol (VITAMIN D3) 1000 units CAPS Take 1,000 Units by mouth daily.    Marland Kitchen doxepin (SINEQUAN) 10 MG capsule Take 10-20 mg by mouth See admin instructions. Take one tablet (10 mg) by mouth twice daily - 5am and 2pm, take 2 tablets (20 mg) at bedtime    . furosemide (LASIX) 20 MG tablet Take 20 mg by mouth daily.    Marland Kitchen losartan (COZAAR) 25 MG tablet Take 25 mg by mouth daily.     . metoprolol tartrate (LOPRESSOR) 25 MG tablet Take 1 tablet (25 mg total) by mouth 2 (two) times daily. (Patient taking differently: Take 25 mg by mouth See admin instructions. Take one tablet (25 mg) by mouth twice daily - 5am and 2pm) 60 tablet 0  . Multiple Vitamin (MULTIVITAMIN WITH MINERALS) TABS tablet Take 1 tablet by mouth daily.    . Omega-3 Fatty Acids (FISH OIL OMEGA-3 PO) Take 1 capsule by mouth daily.    . sotalol (BETAPACE) 80 MG tablet Take 1 tablet (80 mg total) by mouth daily. (Patient taking differently: Take 80 mg by mouth See admin instructions. Take one tablet (80 mg) by mouth twice daily - 5am and 2pm) 30 tablet 0  . triamcinolone cream (KENALOG) 0.1 % Apply 1  application topically at bedtime. For psoriasis    . warfarin (COUMADIN) 1 MG tablet Take 3 mg by mouth See admin instructions. Take three tablets (3 mg) by mouth at 2pm on Saturday and Sunday    . warfarin (COUMADIN) 4 MG tablet Take 4 mg by mouth See admin instructions. Take one tablet (4 mg) by mouth at 2pm on Monday, Tuesday, Wednesday, Thursday, Friday    . albuterol (PROVENTIL HFA;VENTOLIN HFA) 108 (90 Base) MCG/ACT inhaler Inhale 2 puffs into the lungs every 6 (six) hours as needed for wheezing or shortness of breath. (Patient not taking: Reported on 02/06/2018) 1 Inhaler 2   No current facility-administered medications on file prior to visit.     Allergies:  No Known Allergies   Physical Exam  Vitals:   02/06/18 1029  BP: 126/75  Pulse: 79  Weight: 141 lb 12.8 oz (64.3 kg)  Height: 5' (1.524 m)   Body mass index is 27.69 kg/m. No exam data present  General: well developed, well nourished, seated, in no evident distress Head: head normocephalic and atraumatic.   Neck: supple with no carotid or supraclavicular bruits Cardiovascular: regular rate and rhythm, no murmurs Musculoskeletal: no deformity Skin:  no rash/petichiae Vascular:  Normal pulses all extremities  Neurologic Exam Mental Status: Awake and fully alert. Oriented to place and time. Recent and remote memory intact. Attention span, concentration and fund of knowledge appropriate. Mood and affect appropriate.  Cranial Nerves: Fundoscopic exam reveals sharp disc margins. Pupils equal, briskly reactive to light. Extraocular movements full without nystagmus. Visual fields full to confrontation. Hearing intact. Facial sensation intact. Face, tongue, palate moves normally and symmetrically.  Motor: Normal bulk and tone. Normal strength in all tested extremity muscles. Sensory.: intact to touch , pinprick , position and vibratory sensation.  Coordination: Rapid alternating movements normal in all extremities.  Finger-to-nose and heel-to-shin performed accurately bilaterally. Gait and Station: Arises from chair without difficulty. Stance is normal. Gait demonstrates normal stride length and balance with assistance of Rollator walker. Reflexes: 1+ and symmetric. Toes downgoing.    NIHSS  0 Modified Rankin  0 CHA2DS2-VASc  7 HAS-BLED 2   Diagnostic Data (Labs, Imaging, Testing)  CT HEAD WO CONTRAST 12/06/2017 IMPRESSION: 1. Positive for hyperdense right MCA, but no CT changes of acute right MCA infarct and no acute hemorrhage. ASPECTS is 10. 2. Chronic small vessel disease in both hemispheres appears stable since 2018. 3. These results were communicated to Dr. Lorraine Lax at 2:47 pmon 10/26/2019by text page via the [facility] messaging system.  CT CEREBRAL PERFUSION W CONTRAST CT ANGIO HEAD W OR WO CONTRAST CT ANGIO NECK W OR WO CONTRAST 12/06/2017 IMPRESSION: 1. Positive for large vessel occlusion at the distal Right M1. CTA suggests intermediate level collaterals, while CTP detects no core infarct but 26 mL of penumbra in the posterior right MCA territory. This was discussed by telephone with Dr. Lorraine Lax on 12/06/2017 at 1550 hours. 2. Mild for age anterior and posterior circulation atherosclerosis otherwise, with no other hemodynamically significant stenosis. 3. There is 50% stenosis of the Right Subclavian Artery origin due to plaque. 4. Aortic Atherosclerosis (ICD10-I70.0) with ectatic to borderline aneurysmal ascending thoracic aorta and distal arch. Recommend annual imaging followup by Chest CTA or MRA. This recommendation follows 2010 ACCF/AHA/AATS/ACR/ASA/SCA/SCAI/SIR/STS/SVM Guidelines for the Diagnosis and Management of Patients with Thoracic Aortic Disease. Circulation. 2010; 121: X505-W979  MR BRAIN WO CONTRAST 12/06/2017 IMPRESSION: 1. No acute intracranial abnormality. 2. Chronic small vessel ischemia and generalized atrophy.    ASSESSMENT: Theresa Carrillo is a 84 y.o.  year old female here with TIA on 12/06/2017 secondary to atrial fibrillation with subtherapeutic INR as warfarin was held 5 days prior for tooth extraction. Vascular risk factors include HTN, HLD, CHF, atrial fibrillation, prior strokes and mitral valve stenosis.  Patient is being seen today for hospital follow-up and overall has been doing well without recurrent symptoms.    PLAN:  1. TIA: Continue warfarin daily  and Crestor for secondary stroke prevention. Maintain strict control of hypertension with blood pressure goal below 130/90, diabetes with hemoglobin A1c goal below 6.5% and cholesterol with LDL cholesterol (bad cholesterol) goal below 70 mg/dL.  I also advised the patient to eat a healthy diet with plenty of whole grains, cereals, fruits and vegetables, exercise regularly with at least 30 minutes of continuous activity daily and maintain ideal body weight. 2. HTN: Advised to continue current treatment regimen.  Today's BP 126/75.  Advised to continue to monitor at home along with continued follow-up with PCP for management 3. HLD: Advised to continue current treatment regimen along with continued follow-up with PCP for future prescribing and monitoring of lipid panel 4. Atrial fibrillation: Continue Coumadin and continue to follow with cardiologist for management 5. Advised patient that if her double vision in her periphery becomes bothersome, we can place referral for her to be evaluated by neuro-ophthalmologist    Follow up in 3 months or call earlier if needed   Greater than 50% of time during this 25 minute visit was spent on counseling, explanation of diagnosis of TIA, reviewing risk factor management of atrial fibrillation, HTN and HLD, planning of further management along with potential future management, and discussion with patient and family answering all questions.    Venancio Poisson, AGNP-BC  [facility] [address] [place], [place]  [address]  Phone [phone] Fax [phone] Note: This document was prepared with digital dictation and possible smart phrase technology. Any transcriptional errors that result from this process are unintentional.

## 2018-02-15 NOTE — Progress Notes (Signed)
I agree with the above plan 

## 2018-05-08 ENCOUNTER — Ambulatory Visit: Payer: Medicare Other | Admitting: Adult Health

## 2018-08-13 ENCOUNTER — Other Ambulatory Visit: Payer: Self-pay | Admitting: Gastroenterology

## 2018-08-13 DIAGNOSIS — R7989 Other specified abnormal findings of blood chemistry: Secondary | ICD-10-CM

## 2018-08-27 ENCOUNTER — Encounter
Admission: RE | Admit: 2018-08-27 | Discharge: 2018-08-27 | Disposition: A | Discharge status: Home / Self Care | Payer: Medicare Other | Source: Ambulatory Visit | Attending: Emergency Medicine | Admitting: Emergency Medicine

## 2018-08-27 ENCOUNTER — Other Ambulatory Visit: Payer: Self-pay

## 2018-08-27 DIAGNOSIS — R7989 Other specified abnormal findings of blood chemistry: Secondary | ICD-10-CM

## 2018-08-27 DIAGNOSIS — R945 Abnormal results of liver function studies: Secondary | ICD-10-CM | POA: Diagnosis not present

## 2018-08-27 MED ORDER — TECHNETIUM TC 99M MEBROFENIN IV KIT
5.0000 | PACK | Freq: Once | INTRAVENOUS | Status: AC | PRN
  Administered 2018-08-27: 5.014 via INTRAVENOUS

## 2018-11-21 IMAGING — CT CT ABD-PELV W/ CM
2 of 5 series · 16 of 46 positions shown, 18 images · IV contrast (APPLIED)
Comparison: None.

CLINICAL DATA: Diarrhea, weakness.

EXAM:
CT ABDOMEN AND PELVIS WITH CONTRAST
TECHNIQUE: Multidetector CT imaging of the abdomen and pelvis was performed
using the standard protocol following bolus administration of
intravenous contrast.
CONTRAST:  75mL [id number] IOPAMIDOL ([id number]) INJECTION 61%

[Series 2: routine abd/pel with · axial · 0.77mm/px · z∈[-465,-85]mm · 13 of 86 slices shown, 15 images]
[im 5/86  soft-tissue]
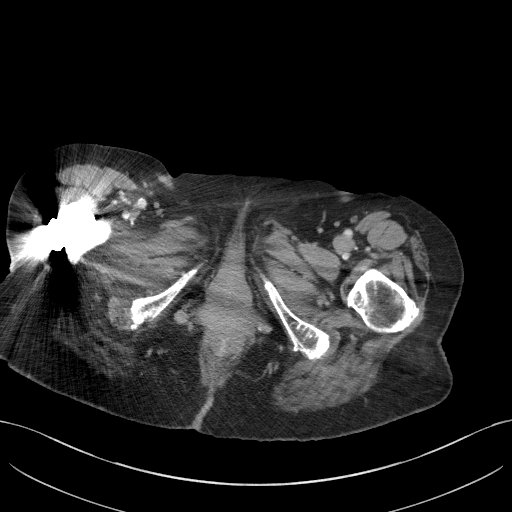
[im 5/86  bone]
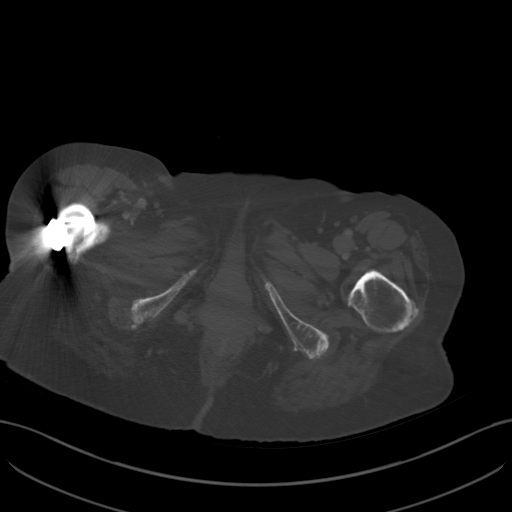
[im 13/86  soft-tissue]
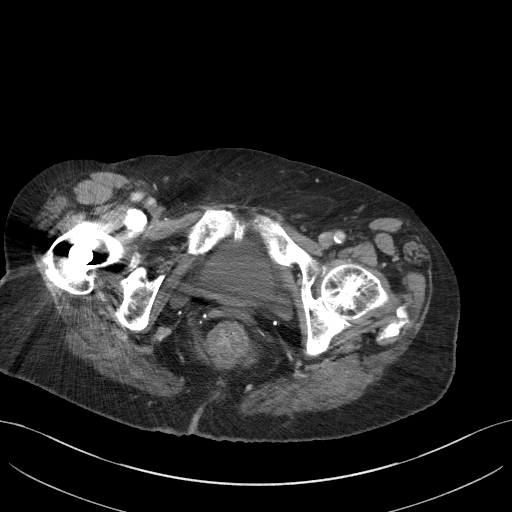
[im 18/86  soft-tissue]
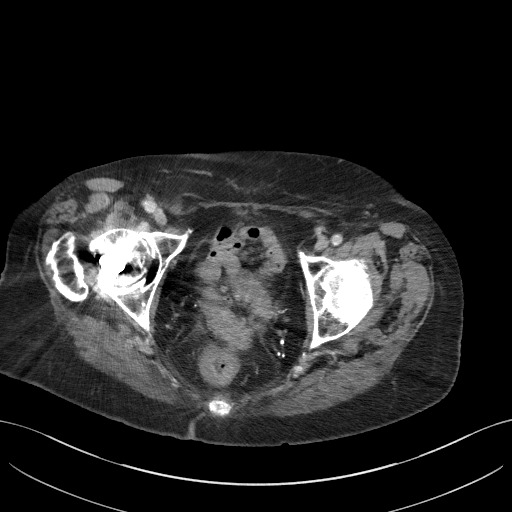
[im 26/86  soft-tissue]
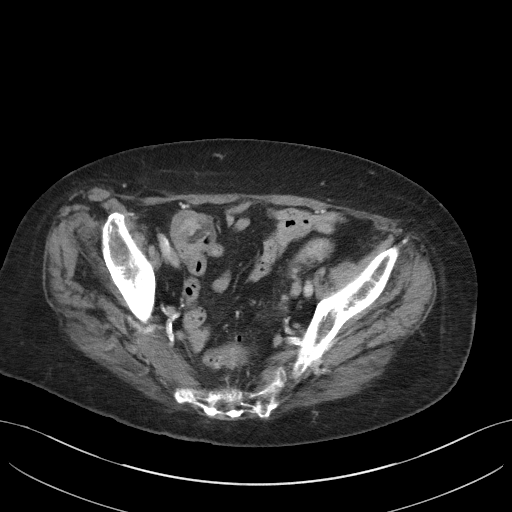
[im 30/86  soft-tissue]
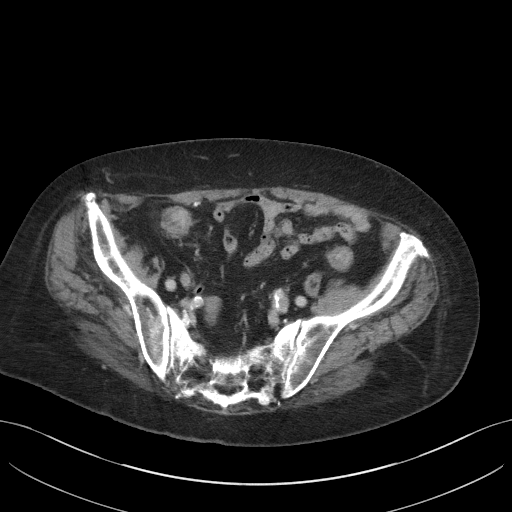
[im 39/86  soft-tissue]
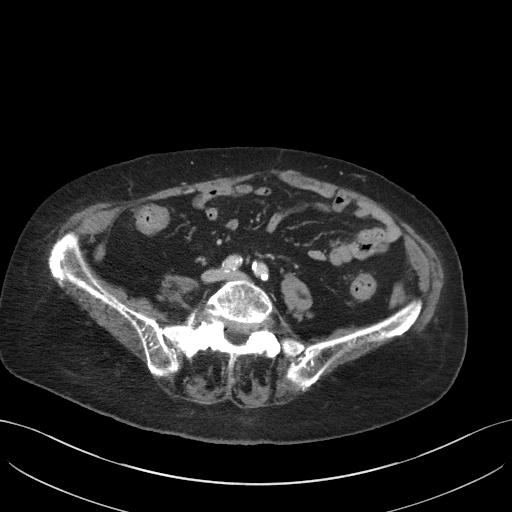
[im 43/86  soft-tissue]
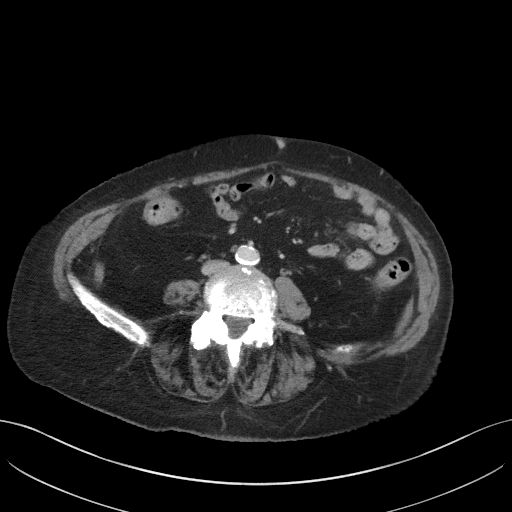
[im 47/86  soft-tissue]
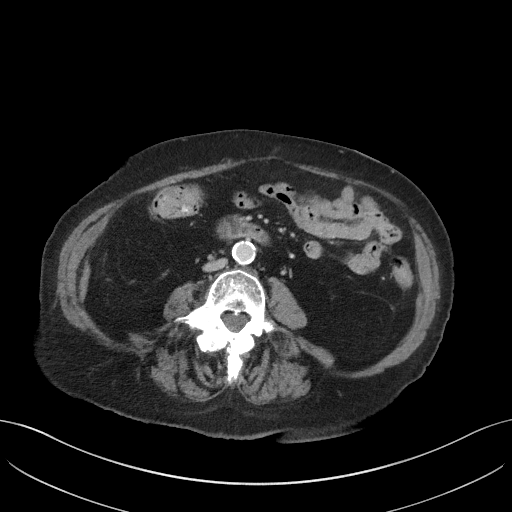
[im 56/86  soft-tissue]
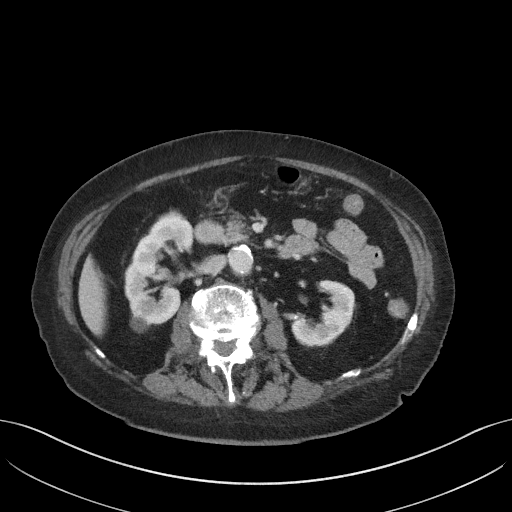
[im 56/86  bone]
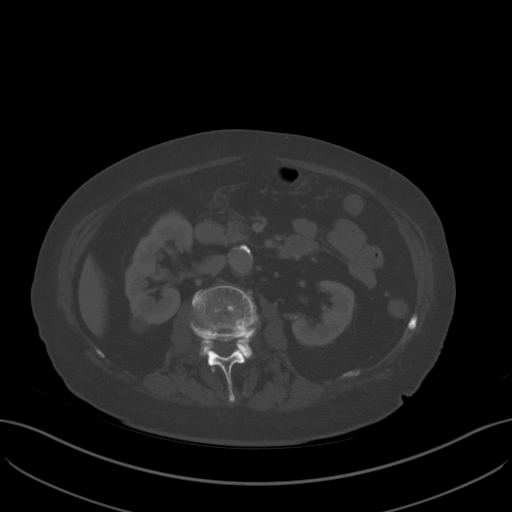
[im 60/86  soft-tissue]
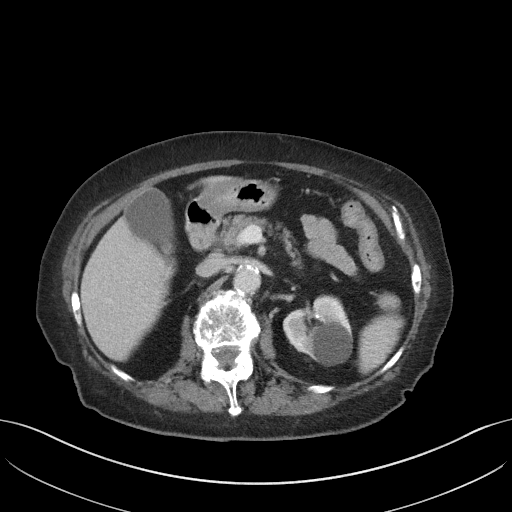
[im 69/86  soft-tissue]
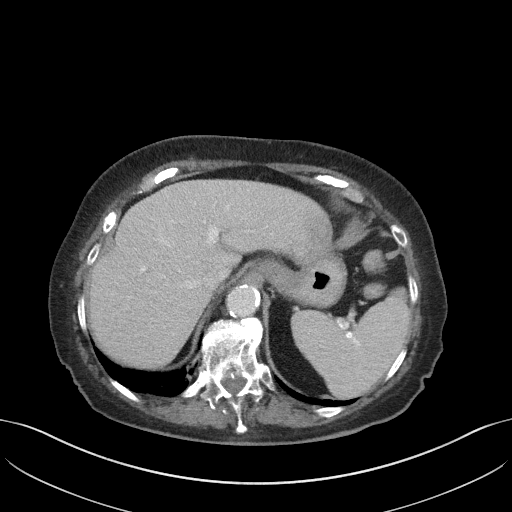
[im 73/86  soft-tissue]
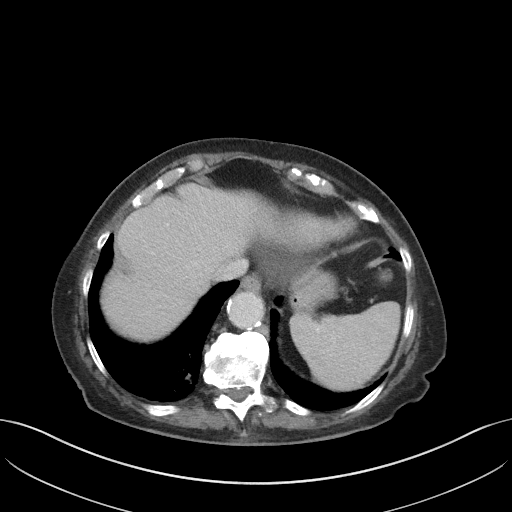
[im 81/86  soft-tissue]
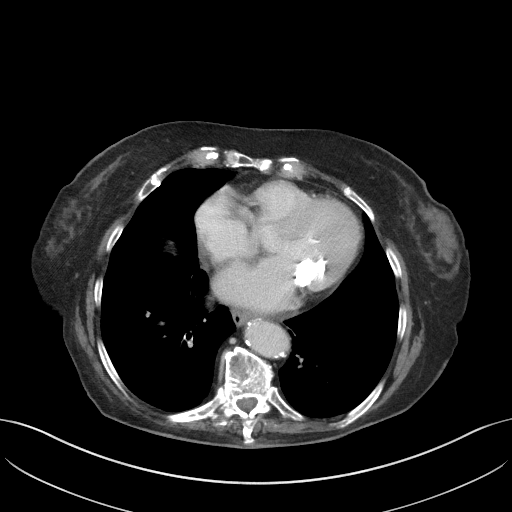

[Series 5: coronal st · coronal · 0.76mm/px · 3 of 81 slices shown]
[im 27/81  soft-tissue]
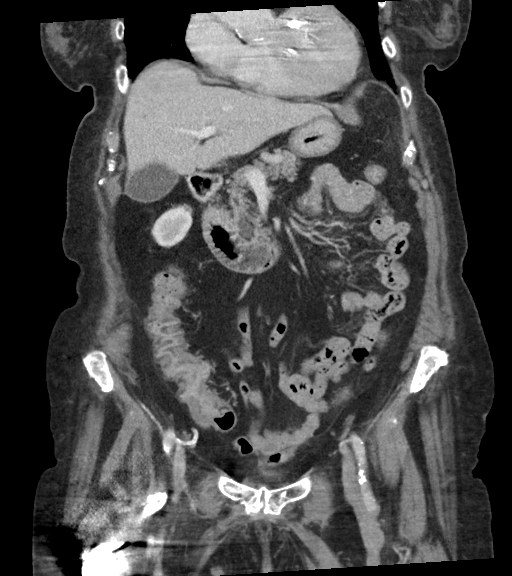
[im 36/81  soft-tissue]
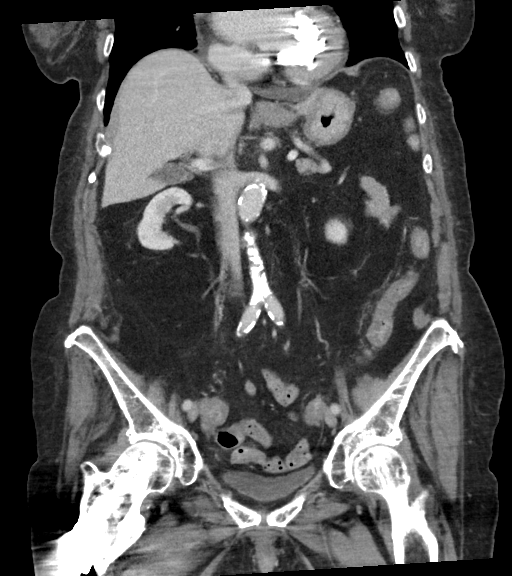
[im 45/81  soft-tissue]
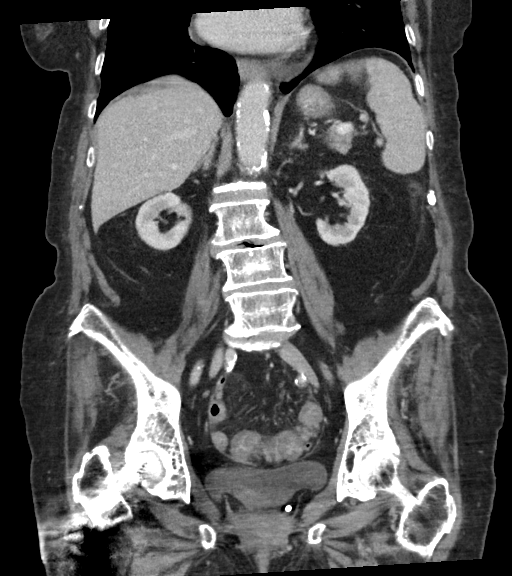

[16 of 46 positions shown; findings below may reference images not displayed]

FINDINGS: Lower chest: No acute abnormality.

Hepatobiliary: Solitary gallstone is noted. No inflammation is
noted. The liver is unremarkable.

Pancreas: Unremarkable. No pancreatic ductal dilatation or
surrounding inflammatory changes.

Spleen: Normal in size without focal abnormality.

Adrenals/Urinary Tract: Adrenal glands appear normal. Bilateral
simple renal cysts are noted. No hydronephrosis or renal obstruction
is noted. Nonobstructive left nephrolithiasis is noted. No ureteral
calculi are noted. Urinary bladder is unremarkable.

Stomach/Bowel: The appendix appears normal. The stomach appears
normal. Wall thickening of descending and sigmoid colon is noted,
consistent with infectious or inflammatory colitis.

Vascular/Lymphatic: Aortic atherosclerosis. No enlarged abdominal or
pelvic lymph nodes.

Reproductive: Status post hysterectomy. No adnexal masses.

Other: No abdominal wall hernia or abnormality. No abdominopelvic
ascites.

Musculoskeletal: Old L2 compression fracture is noted. No acute
abnormality is noted.
IMPRESSION: Wall thickening of descending and sigmoid colon is noted concerning
for infectious or inflammatory colitis.

Cholelithiasis without inflammation.

Nonobstructive left nephrolithiasis. No hydronephrosis or renal
obstruction is noted.

Aortic atherosclerosis.

## 2020-06-20 ENCOUNTER — Other Ambulatory Visit: Payer: Self-pay

## 2020-06-20 ENCOUNTER — Ambulatory Visit (INDEPENDENT_AMBULATORY_CARE_PROVIDER_SITE_OTHER): Payer: Medicare Other | Admitting: Dermatology

## 2020-06-20 DIAGNOSIS — Z85828 Personal history of other malignant neoplasm of skin: Secondary | ICD-10-CM

## 2020-06-20 DIAGNOSIS — L821 Other seborrheic keratosis: Secondary | ICD-10-CM

## 2020-06-20 DIAGNOSIS — L219 Seborrheic dermatitis, unspecified: Secondary | ICD-10-CM

## 2020-06-20 DIAGNOSIS — L309 Dermatitis, unspecified: Secondary | ICD-10-CM

## 2020-06-20 MED ORDER — KETOCONAZOLE 2 % EX SHAM: 1.0000 "application " | MEDICATED_SHAMPOO | Freq: Once | CUTANEOUS | 5 refills | Status: AC

## 2020-06-20 MED ORDER — CLOBETASOL PROPIONATE 0.05 % EX SOLN: 1.0000 "application " | Freq: Two times a day (BID) | CUTANEOUS | 2 refills | Status: DC

## 2020-06-20 MED ORDER — KETOCONAZOLE 2 % EX CREA: 1.0000 "application " | TOPICAL_CREAM | Freq: Two times a day (BID) | CUTANEOUS | 5 refills | Status: AC

## 2020-06-20 NOTE — Progress Notes (Signed)
   New Patient Visit  Subjective  Theresa Carrillo is a 87 y.o. female who presents for the following: Dermatitis (New patient here today for seb derm at scalp and face. She is using ketoconazole 2% shampoo at scalp twice a week, leaving it on for 10 minutes and ketoconazole 2% cream every other day at the face which seems to help. In the past she was given TMC 0.1% cream. ).  Patient with hx of BCC and SCC. Will request records form [facility]. Her last TBSE was about 1 year ago.  Patient advises she has been told that she does have psoriasis.   The following portions of the chart were reviewed this encounter and updated as appropriate:   Tobacco  Allergies  Meds  Problems  Med Hx  Surg Hx  Fam Hx      Review of Systems:  No other skin or systemic complaints except as noted in HPI or Assessment and Plan.  Objective  Well appearing patient in no apparent distress; mood and affect are within normal limits.  A focused examination was performed including face, scalp, legs, arms. Relevant physical exam findings are noted in the Assessment and Plan.  Objective  Scalp: Thick scaly pink plaques  Objective  Right elbow: Scaly pink plaque   Assessment & Plan  Seborrheic dermatitis Scalp  Chronic condition with duration over one year. Condition is bothersome to patient. Currently flared.  Continue ketoconazole 2% shampoo alternating with Neutrogena T-Sal, leaving on for 10 minutes before rinsing out. Use each shampoo at least once weekly.   Start clobetasol 0.05% solution twice daily to to scalp, ears, behind ears. Avoid applying to face, groin, and axilla. Use as directed. Risk of skin atrophy with long-term use reviewed.   Continue ketoconazole 2% cream twice daily to areas at face as needed for itch/rash.   Topical steroids (such as triamcinolone, fluocinolone, fluocinonide, mometasone, clobetasol, halobetasol, betamethasone, hydrocortisone) can cause thinning and  lightening of the skin if they are used for too long in the same area. Your physician has selected the right strength medicine for your problem and area affected on the body. Please use your medication only as directed by your physician to prevent side effects.    Ordered Medications: clobetasol (TEMOVATE) 0.05 % external solution ketoconazole (NIZORAL) 2 % cream  Dermatitis Right elbow  Vs psoriasis  Start clobetasol 0.05% solution twice a day for up to 2 weeks. Avoid applying to face, groin, and axilla. Use as directed. Risk of skin atrophy with long-term use reviewed.   Topical steroids (such as triamcinolone, fluocinolone, fluocinonide, mometasone, clobetasol, halobetasol, betamethasone, hydrocortisone) can cause thinning and lightening of the skin if they are used for too long in the same area. Your physician has selected the right strength medicine for your problem and area affected on the body. Please use your medication only as directed by your physician to prevent side effects.    Ordered Medications: clobetasol (TEMOVATE) 0.05 % external solution  Seborrheic Keratoses - Stuck-on, waxy, tan-brown papules and/or plaques  - Benign-appearing - Discussed benign etiology and prognosis. - Observe - Call for any changes   Return in about 6 weeks (around 08/01/2020) for TBSE, Dermatitis.  Graciella Belton, RMA, am acting as scribe for Forest Gleason, MD .  Documentation: I have reviewed the above documentation for accuracy and completeness, and I agree with the above.  Forest Gleason, MD

## 2020-06-20 NOTE — Patient Instructions (Addendum)
Continue ketoconazole 2% shampoo alternating with Neutrogena T-Sal, leaving on for 10 minutes before rinsing out. Use each shampoo at least once weekly.  Start clobetasol 0.05% solution twice daily to to scalp, ears, behind ears, spot at right arm. Do not use at areas at the arms for more than 2 weeks. Avoid applying to face, groin, and axilla. Use as directed. Risk of skin atrophy with long-term use reviewed.  Continue ketoconazole 2% cream twice daily to areas at face as needed for itch/rash.    Topical steroids (such as triamcinolone, fluocinolone, fluocinonide, mometasone, clobetasol, halobetasol, betamethasone, hydrocortisone) can cause thinning and lightening of the skin if they are used for too long in the same area. Your physician has selected the right strength medicine for your problem and area affected on the body. Please use your medication only as directed by your physician to prevent side effects.   If you have any questions or concerns for your doctor, please call our main line at [phone] and press option 4 to reach your doctor's medical assistant. If no one answers, please leave a voicemail as directed and we will return your call as soon as possible. Messages left after 4 pm will be answered the following business day.   You may also send [place] a message via [place]. We typically respond to MyChart messages within 1-2 business days.  For prescription refills, please ask your pharmacy to contact our office. Our fax number is [phone].  If you have an urgent issue when the clinic is closed that cannot wait until the next business day, you can page your doctor at the number below.    Please note that while we do our best to be available for urgent issues outside of office hours, we are not available 24/7.   If you have an urgent issue and are unable to reach [place], you may choose to seek medical care at your doctor's office, retail clinic, urgent care center, or emergency room.  If  you have a medical emergency, please immediately call 911 or go to the emergency department.  Pager Numbers  - Dr. Nehemiah Massed: [phone]  - Dr. Laurence Ferrari: [phone]  - Dr. Nicole Kindred: [phone]  In the event of inclement weather, please call our main line at [phone] for an update on the status of any delays or closures.  Dermatology Medication Tips: Please keep the boxes that topical medications come in in order to help keep track of the instructions about where and how to use these. Pharmacies typically print the medication instructions only on the boxes and not directly on the medication tubes.   If your medication is too expensive, please contact our office at [phone] option 4 or send [place] a message through [place].   We are unable to tell what your co-pay for medications will be in advance as this is different depending on your insurance coverage. However, we may be able to find a substitute medication at lower cost or fill out paperwork to get insurance to cover a needed medication.   If a prior authorization is required to get your medication covered by your insurance company, please allow [place] 1-2 business days to complete this process.  Drug prices often vary depending on where the prescription is filled and some pharmacies may offer cheaper prices.  The website www.goodrx.com contains coupons for medications through different pharmacies. The prices here do not account for what the cost may be with help from insurance (it may be cheaper with your insurance), but the  website can give you the price if you did not use any insurance.  - You can print the associated coupon and take it with your prescription to the pharmacy.  - You may also stop by our office during regular business hours and pick up a GoodRx coupon card.  - If you need your prescription sent electronically to a different pharmacy, notify our office through [facility] or by phone at [phone] option  4.

## 2020-06-26 ENCOUNTER — Encounter: Payer: Self-pay | Admitting: Dermatology

## 2020-08-01 ENCOUNTER — Ambulatory Visit (INDEPENDENT_AMBULATORY_CARE_PROVIDER_SITE_OTHER): Payer: Medicare Other | Admitting: Dermatology

## 2020-08-01 ENCOUNTER — Encounter: Payer: Self-pay | Admitting: Dermatology

## 2020-08-01 ENCOUNTER — Other Ambulatory Visit: Payer: Self-pay

## 2020-08-01 DIAGNOSIS — L309 Dermatitis, unspecified: Secondary | ICD-10-CM

## 2020-08-01 DIAGNOSIS — L570 Actinic keratosis: Secondary | ICD-10-CM

## 2020-08-01 DIAGNOSIS — Z85828 Personal history of other malignant neoplasm of skin: Secondary | ICD-10-CM

## 2020-08-01 DIAGNOSIS — Z1283 Encounter for screening for malignant neoplasm of skin: Secondary | ICD-10-CM

## 2020-08-01 DIAGNOSIS — D1800 Hemangioma unspecified site: Secondary | ICD-10-CM

## 2020-08-01 DIAGNOSIS — L219 Seborrheic dermatitis, unspecified: Secondary | ICD-10-CM

## 2020-08-01 DIAGNOSIS — L814 Other melanin hyperpigmentation: Secondary | ICD-10-CM

## 2020-08-01 DIAGNOSIS — D229 Melanocytic nevi, unspecified: Secondary | ICD-10-CM

## 2020-08-01 DIAGNOSIS — L578 Other skin changes due to chronic exposure to nonionizing radiation: Secondary | ICD-10-CM

## 2020-08-01 DIAGNOSIS — L821 Other seborrheic keratosis: Secondary | ICD-10-CM

## 2020-08-01 DIAGNOSIS — R21 Rash and other nonspecific skin eruption: Secondary | ICD-10-CM

## 2020-08-01 MED ORDER — CLOBETASOL PROPIONATE 0.05 % EX SOLN: 1.0000 "application " | Freq: Two times a day (BID) | CUTANEOUS | 2 refills | Status: AC

## 2020-08-01 NOTE — Progress Notes (Deleted)
   Follow-Up Visit   Subjective  Theresa Carrillo is a 87 y.o. female who presents for the following: Annual Exam (Mole check ).   The following portions of the chart were reviewed this encounter and updated as appropriate:        Review of Systems:  No other skin or systemic complaints except as noted in HPI or Assessment and Plan.  Objective  Well appearing patient in no apparent distress; mood and affect are within normal limits.  {[id number]::"O full examination was performed including scalp, head, eyes, ears, nose, lips, neck, chest, axillae, abdomen, back, buttocks, bilateral upper extremities, bilateral lower extremities, hands, feet, fingers, toes, fingernails, and toenails. All findings within normal limits unless otherwise noted below."}    Assessment & Plan   No follow-ups on file.

## 2020-08-01 NOTE — Progress Notes (Signed)
Follow-Up Visit   Subjective  Theresa Carrillo is a 87 y.o. female who presents for the following: Annual Exam (Mole check ). Hx of BCC, hx of SCC removed at [facility] dermatology.  Hx of Seborrheic dermatitis treating with Ketoconazole shampoo, Clobetasol solution. Has scaly areas at ears and some at face.   The following portions of the chart were reviewed this encounter and updated as appropriate:   Tobacco  Allergies  Meds  Problems  Med Hx  Surg Hx  Fam Hx       Review of Systems:  No other skin or systemic complaints except as noted in HPI or Assessment and Plan.  Objective  Well appearing patient in no apparent distress; mood and affect are within normal limits.  A full examination was performed including scalp, head, eyes, ears, nose, lips, neck, chest, axillae, abdomen, back, buttocks, bilateral upper extremities, bilateral lower extremities, hands, feet, fingers, toes, fingernails, and toenails. All findings within normal limits unless otherwise noted below.  bilateral arms Erythematous macules with few excoriations   right forehead Erythematous thin papules/macules with gritty scale.   Scalp Pink patches with greasy scale at ears, scalp, and nasolabial folds  right cheek, nose Well healed scar with no evidence of recurrence,   Assessment & Plan  Rash bilateral arms  Resolving rash, unclear etiology  For crusted areas at arms, use vaseline 2-3 times per day For itchy areas at arms, use Sarna as needed.   Also advised can use Clobetasol solution apply to elbows twice a day as needed for rash up to 2 weeks. Avoid applying to face, groin, and axilla. Use as directed. Risk of skin atrophy with long-term use reviewed.    Topical steroids (such as triamcinolone, fluocinolone, fluocinonide, mometasone, clobetasol, halobetasol, betamethasone, hydrocortisone) can cause thinning and lightening of the skin if they are used for too long in the same area. Your  physician has selected the right strength medicine for your problem and area affected on the body. Please use your medication only as directed by your physician to prevent side effects.      AK (actinic keratosis) right forehead  Prior to procedure, discussed risks of blister formation, small wound, skin dyspigmentation, or rare scar following cryotherapy. Recommend Vaseline ointment to treated areas while healing.  Actinic keratoses are precancerous spots that appear secondary to cumulative UV radiation exposure/sun exposure over time. They are chronic with expected duration over 1 year. A portion of actinic keratoses will progress to squamous cell carcinoma of the skin. It is not possible to reliably predict which spots will progress to skin cancer and so treatment is recommended to prevent development of skin cancer.  Recommend daily broad spectrum sunscreen SPF 30+ to sun-exposed areas, reapply every 2 hours as needed.  Recommend staying in the shade or wearing long sleeves, sun glasses (UVA+UVB protection) and wide brim hats (4-inch brim around the entire circumference of the hat). Call for new or changing lesions.   Destruction of lesion - right forehead Complexity: simple   Destruction method: cryotherapy   Informed consent: discussed and consent obtained   Timeout:  patient name, date of birth, surgical site, and procedure verified Lesion destroyed using liquid nitrogen: Yes   Region frozen until ice ball extended beyond lesion: Yes   Outcome: patient tolerated procedure well with no complications   Post-procedure details: wound care instructions given    Seborrheic dermatitis Scalp  Chronic condition with duration over one year. Condition is bothersome to patient. Not  currently at goal.  Continue ketoconazole shampoo apply 2-3 times per week, massage into scalp and leave in for 10 minutes before rinsing out. Alternate with T-Sal shampoo on other days.   Use ketoconazole  cream daily to ears and areas at the face where you get rash.  If scalp is itchy or especially scaly, use clobetasol solution up to once a day. Avoid applying to face, groin, and axilla. Use as directed. Risk of skin atrophy with long-term use reviewed.   Topical steroids (such as triamcinolone, fluocinolone, fluocinonide, mometasone, clobetasol, halobetasol, betamethasone, hydrocortisone) can cause thinning and lightening of the skin if they are used for too long in the same area. Your physician has selected the right strength medicine for your problem and area affected on the body. Please use your medication only as directed by your physician to prevent side effects.  Related Medications ketoconazole (NIZORAL) 2 % cream Apply 1 application topically 2 (two) times daily.  clobetasol (TEMOVATE) 0.05 % external solution Apply 1 application topically 2 (two) times daily. Apply twice daily to scalp, ears, behind ears. Apply twice daily to spot at right arm for up to 2 weeks. Avoid applying to face, groin, and axilla. Use as directed. Risk of skin atrophy with long-term use reviewed.  Dermatitis  Related Medications clobetasol (TEMOVATE) 0.05 % external solution Apply 1 application topically 2 (two) times daily. Apply twice daily to scalp, ears, behind ears. Apply twice daily to spot at right arm for up to 2 weeks. Avoid applying to face, groin, and axilla. Use as directed. Risk of skin atrophy with long-term use reviewed.  History of skin cancer right cheek, nose  Clear. Observe for recurrence. Call clinic for new or changing lesions.  Recommend regular skin exams, daily broad-spectrum spf 30+ sunscreen use, and photoprotection.     Release of records signed and faxed to [facility] dermatology today, we will review at the next office visit    Lentigines - Scattered tan macules - Due to sun exposure - Benign-appering, observe - Recommend daily broad spectrum sunscreen SPF 30+ to sun-exposed  areas, reapply every 2 hours as needed. - Call for any changes  Seborrheic Keratoses - Stuck-on, waxy, tan-brown papules and/or plaques  - Benign-appearing - Discussed benign etiology and prognosis. - Observe - Call for any changes  Melanocytic Nevi - Tan-brown and/or pink-flesh-colored symmetric macules and papules - Benign appearing on exam today - Observation - Call clinic for new or changing moles - Recommend daily use of broad spectrum spf 30+ sunscreen to sun-exposed areas.   Hemangiomas - Red papules - Discussed benign nature - Observe - Call for any changes  Actinic Damage - Chronic condition, secondary to cumulative UV/sun exposure - diffuse scaly erythematous macules with underlying dyspigmentation - Recommend daily broad spectrum sunscreen SPF 30+ to sun-exposed areas, reapply every 2 hours as needed.  - Staying in the shade or wearing long sleeves, sun glasses (UVA+UVB protection) and wide brim hats (4-inch brim around the entire circumference of the hat) are also recommended for sun protection.  - Call for new or changing lesions.  Skin cancer screening performed today.   Return in about 3 months (around 11/01/2020) for recheck AK, Seborreheic dermatitis, review skin cancer records from previous derms.  I, Marye Round, CMA, am acting as scribe for Forest Gleason, MD .   Documentation: I have reviewed the above documentation for accuracy and completeness, and I agree with the above.  Forest Gleason, MD

## 2020-08-01 NOTE — Patient Instructions (Addendum)
Continue ketoconazole shampoo apply 2-3 times per week, massage into scalp and leave in for 10 minutes before rinsing out. Alternate with T-Sal shampoo on other days.   Use ketoconazole cream daily to ears and areas at the face where you get rash.  If scalp is itchy or especially scaly, use clobetasol solution up to once a day.  For crusted areas at arms, use vaseline 2-3 times per day For itchy areas at arms, use Sarna as needed.   If you have any questions or concerns for your doctor, please call our main line at [phone] and press option 4 to reach your doctor's medical assistant. If no one answers, please leave a voicemail as directed and we will return your call as soon as possible. Messages left after 4 pm will be answered the following business day.   You may also send [place] a message via [place]. We typically respond to MyChart messages within 1-2 business days.  For prescription refills, please ask your pharmacy to contact our office. Our fax number is [phone].  If you have an urgent issue when the clinic is closed that cannot wait until the next business day, you can page your doctor at the number below.    Please note that while we do our best to be available for urgent issues outside of office hours, we are not available 24/7.   If you have an urgent issue and are unable to reach [place], you may choose to seek medical care at your doctor's office, retail clinic, urgent care center, or emergency room.  If you have a medical emergency, please immediately call 911 or go to the emergency department.  Pager Numbers  - Dr. Nehemiah Massed: [phone]  - Dr. Laurence Ferrari: [phone]  - Dr. Nicole Kindred: [phone]  In the event of inclement weather, please call our main line at [phone] for an update on the status of any delays or closures.  Dermatology Medication Tips: Please keep the boxes that topical medications come in in order to help keep track of the instructions about where  and how to use these. Pharmacies typically print the medication instructions only on the boxes and not directly on the medication tubes.   If your medication is too expensive, please contact our office at [phone] option 4 or send [place] a message through [place].   We are unable to tell what your co-pay for medications will be in advance as this is different depending on your insurance coverage. However, we may be able to find a substitute medication at lower cost or fill out paperwork to get insurance to cover a needed medication.   If a prior authorization is required to get your medication covered by your insurance company, please allow [place] 1-2 business days to complete this process.  Drug prices often vary depending on where the prescription is filled and some pharmacies may offer cheaper prices.  The website www.goodrx.com contains coupons for medications through different pharmacies. The prices here do not account for what the cost may be with help from insurance (it may be cheaper with your insurance), but the website can give you the price if you did not use any insurance.  - You can print the associated coupon and take it with your prescription to the pharmacy.  - You may also stop by our office during regular business hours and pick up a GoodRx coupon card.  - If you need your prescription sent electronically to a different pharmacy, notify our office through [facility]  or by phone at [phone] option 4.

## 2020-11-23 ENCOUNTER — Ambulatory Visit (INDEPENDENT_AMBULATORY_CARE_PROVIDER_SITE_OTHER): Payer: Medicare Other | Admitting: Dermatology

## 2020-11-23 ENCOUNTER — Encounter: Payer: Self-pay | Admitting: Dermatology

## 2020-11-23 ENCOUNTER — Other Ambulatory Visit: Payer: Self-pay

## 2020-11-23 DIAGNOSIS — L821 Other seborrheic keratosis: Secondary | ICD-10-CM | POA: Diagnosis not present

## 2020-11-23 DIAGNOSIS — L219 Seborrheic dermatitis, unspecified: Secondary | ICD-10-CM | POA: Diagnosis not present

## 2020-11-23 DIAGNOSIS — Z872 Personal history of diseases of the skin and subcutaneous tissue: Secondary | ICD-10-CM | POA: Diagnosis not present

## 2020-11-23 NOTE — Progress Notes (Signed)
   Follow-Up Visit   Subjective  Theresa Carrillo is a 87 y.o. female who presents for the following: Follow-up (Patient here today for seb derm and AK follow up. AK treated at right forehead, patient not sure if clear. Patient using ketoconazole shampoo once a week for scalp and has used clobetasol solution for scalp, ketoconazole cream for areas at face and ears. No itching per patient. No areas of concern for patient today. ).  The following portions of the chart were reviewed this encounter and updated as appropriate:   Tobacco  Allergies  Meds  Problems  Med Hx  Surg Hx  Fam Hx      Review of Systems:  No other skin or systemic complaints except as noted in HPI or Assessment and Plan.  Objective  Well appearing patient in no apparent distress; mood and affect are within normal limits.  A focused examination was performed including arms, face, scalp. Relevant physical exam findings are noted in the Assessment and Plan.  Scalp Scale and erythema at ears   Assessment & Plan  Seborrheic dermatitis Scalp  Chronic condition with duration or expected duration over one year. Condition is bothersome to patient. Not currently at goal (ears are primary area bothering her now).  Continue ketoconazole 2% shampoo apply once every 2 weeks, massage into scalp and leave in for 10 minutes before rinsing out. Increase as needed for itch.  May use clobetasol once daily for up to 2 weeks to spot treat areas at scalp and ears as needed for itch. Avoid applying to face, groin, and axilla. Use as directed. Risk of skin atrophy with long-term use reviewed.   Start ketoconazole 2% cream to ears twice daily as needed for rash and itch. Continue twice a day as needed to face.  Topical steroids (such as triamcinolone, fluocinolone, fluocinonide, mometasone, clobetasol, halobetasol, betamethasone, hydrocortisone) can cause thinning and lightening of the skin if they are used for too long in the same  area. Your physician has selected the right strength medicine for your problem and area affected on the body. Please use your medication only as directed by your physician to prevent side effects.     Related Medications clobetasol (TEMOVATE) 0.05 % external solution Apply 1 application topically 2 (two) times daily. Apply twice daily to scalp, ears, behind ears. Apply twice daily to spot at right arm for up to 2 weeks. Avoid applying to face, groin, and axilla. Use as directed. Risk of skin atrophy with long-term use reviewed.  Seborrheic Keratoses - Stuck-on, waxy, tan-brown papules and/or plaques  - Benign-appearing - Discussed benign etiology and prognosis. - Observe - Call for any changes  History of PreCancerous Actinic Keratosis  - site(s) of PreCancerous Actinic Keratosis clear today. - these may recur and new lesions may form requiring treatment to prevent transformation into skin cancer - observe for new or changing spots and contact [place] for appointment if occur - photoprotection with sun protective clothing; sunglasses and broad spectrum sunscreen with SPF of at least 30 + and frequent self skin exams recommended - yearly exams by a dermatologist recommended for persons with history of PreCancerous Actinic Keratoses  Return in about 1 year (around 11/23/2021) for sun exposed areas.  Graciella Belton, RMA, am acting as scribe for Forest Gleason, MD .  Documentation: I have reviewed the above documentation for accuracy and completeness, and I agree with the above.  Forest Gleason, MD

## 2020-11-23 NOTE — Patient Instructions (Addendum)
Continue ketoconazole 2% shampoo apply once every 2 weeks, massage into scalp and leave in for 10 minutes before rinsing out. Increase as needed for itch.  May use clobetasol once daily for up to 2 weeks to spot treat areas at scalp as needed for itch. Avoid applying to face, groin, and axilla. Use as directed. Risk of skin atrophy with long-term use reviewed.   Continue ketoconazole 2% cream to ears twice daily and to face as needed for rash and itch.   Topical steroids (such as triamcinolone, fluocinolone, fluocinonide, mometasone, clobetasol, halobetasol, betamethasone, hydrocortisone) can cause thinning and lightening of the skin if they are used for too long in the same area. Your physician has selected the right strength medicine for your problem and area affected on the body. Please use your medication only as directed by your physician to prevent side effects.   Continue Sarna to arms as needed for itch. May use another moisturizer or Vaseline when not itching.   If you have any questions or concerns for your doctor, please call our main line at [phone] and press option 4 to reach your doctor's medical assistant. If no one answers, please leave a voicemail as directed and we will return your call as soon as possible. Messages left after 4 pm will be answered the following business day.   You may also send [place] a message via [place]. We typically respond to MyChart messages within 1-2 business days.  For prescription refills, please ask your pharmacy to contact our office. Our fax number is [phone].  If you have an urgent issue when the clinic is closed that cannot wait until the next business day, you can page your doctor at the number below.    Please note that while we do our best to be available for urgent issues outside of office hours, we are not available 24/7.   If you have an urgent issue and are unable to reach [place], you may choose to seek medical care at your doctor's  office, retail clinic, urgent care center, or emergency room.  If you have a medical emergency, please immediately call 911 or go to the emergency department.  Pager Numbers  - Dr. Nehemiah Massed: [phone]  - Dr. Laurence Ferrari: [phone]  - Dr. Nicole Kindred: [phone]  In the event of inclement weather, please call our main line at [phone] for an update on the status of any delays or closures.  Dermatology Medication Tips: Please keep the boxes that topical medications come in in order to help keep track of the instructions about where and how to use these. Pharmacies typically print the medication instructions only on the boxes and not directly on the medication tubes.   If your medication is too expensive, please contact our office at [phone] option 4 or send [place] a message through [place].   We are unable to tell what your co-pay for medications will be in advance as this is different depending on your insurance coverage. However, we may be able to find a substitute medication at lower cost or fill out paperwork to get insurance to cover a needed medication.   If a prior authorization is required to get your medication covered by your insurance company, please allow [place] 1-2 business days to complete this process.  Drug prices often vary depending on where the prescription is filled and some pharmacies may offer cheaper prices.  The website www.goodrx.com contains coupons for medications through different pharmacies. The prices here do not account for what the  cost may be with help from insurance (it may be cheaper with your insurance), but the website can give you the price if you did not use any insurance.  - You can print the associated coupon and take it with your prescription to the pharmacy.  - You may also stop by our office during regular business hours and pick up a GoodRx coupon card.  - If you need your prescription sent electronically to a different pharmacy, notify our office  through [facility] or by phone at [phone] option 4.

## 2020-11-29 ENCOUNTER — Other Ambulatory Visit
Admission: RE | Admit: 2020-11-29 | Discharge: 2020-11-29 | Disposition: A | Discharge status: Home / Self Care | Payer: Medicare Other | Source: Ambulatory Visit | Attending: Internal Medicine | Admitting: Internal Medicine

## 2020-11-29 DIAGNOSIS — R0602 Shortness of breath: Secondary | ICD-10-CM | POA: Insufficient documentation

## 2020-11-29 DIAGNOSIS — Z01818 Encounter for other preprocedural examination: Secondary | ICD-10-CM | POA: Diagnosis present

## 2020-11-29 DIAGNOSIS — Z79899 Other long term (current) drug therapy: Secondary | ICD-10-CM | POA: Diagnosis present

## 2020-11-29 LAB — BRAIN NATRIURETIC PEPTIDE: B Natriuretic Peptide: 1101.1 pg/mL — ABNORMAL HIGH (ref 0.0–100.0)

## 2020-12-11 ENCOUNTER — Other Ambulatory Visit: Payer: Self-pay

## 2020-12-22 ENCOUNTER — Other Ambulatory Visit: Payer: Self-pay

## 2020-12-22 ENCOUNTER — Emergency Department: Payer: Medicare Other

## 2020-12-22 ENCOUNTER — Emergency Department
Admission: EM | Admit: 2020-12-22 | Discharge: 2020-12-22 | Disposition: A | Discharge status: Home / Self Care | Payer: Medicare Other | Attending: Emergency Medicine | Admitting: Emergency Medicine

## 2020-12-22 DIAGNOSIS — Z96653 Presence of artificial knee joint, bilateral: Secondary | ICD-10-CM | POA: Diagnosis not present

## 2020-12-22 DIAGNOSIS — I4891 Unspecified atrial fibrillation: Secondary | ICD-10-CM | POA: Insufficient documentation

## 2020-12-22 DIAGNOSIS — Z7901 Long term (current) use of anticoagulants: Secondary | ICD-10-CM | POA: Insufficient documentation

## 2020-12-22 DIAGNOSIS — U071 COVID-19: Secondary | ICD-10-CM | POA: Diagnosis not present

## 2020-12-22 DIAGNOSIS — I130 Hypertensive heart and chronic kidney disease with heart failure and stage 1 through stage 4 chronic kidney disease, or unspecified chronic kidney disease: Secondary | ICD-10-CM | POA: Diagnosis not present

## 2020-12-22 DIAGNOSIS — I5032 Chronic diastolic (congestive) heart failure: Secondary | ICD-10-CM | POA: Insufficient documentation

## 2020-12-22 DIAGNOSIS — Z87891 Personal history of nicotine dependence: Secondary | ICD-10-CM | POA: Diagnosis not present

## 2020-12-22 DIAGNOSIS — Z79899 Other long term (current) drug therapy: Secondary | ICD-10-CM | POA: Diagnosis not present

## 2020-12-22 DIAGNOSIS — N189 Chronic kidney disease, unspecified: Secondary | ICD-10-CM | POA: Insufficient documentation

## 2020-12-22 DIAGNOSIS — R0602 Shortness of breath: Secondary | ICD-10-CM | POA: Diagnosis present

## 2020-12-22 DIAGNOSIS — I5021 Acute systolic (congestive) heart failure: Secondary | ICD-10-CM | POA: Insufficient documentation

## 2020-12-22 LAB — BASIC METABOLIC PANEL
Anion gap: 7 (ref 5–15)
BUN: 16 mg/dL (ref 8–23)
CO2: 27 mmol/L (ref 22–32)
Calcium: 8.4 mg/dL — ABNORMAL LOW (ref 8.9–10.3)
Chloride: 98 mmol/L (ref 98–111)
Creatinine, Ser: 0.88 mg/dL (ref 0.44–1.00)
GFR, Estimated: >60 mL/min (ref >60)
Glucose, Bld: 90 mg/dL (ref 70–99)
Potassium: 4 mmol/L (ref 3.5–5.1)
Sodium: 132 mmol/L — ABNORMAL LOW (ref 135–145)

## 2020-12-22 LAB — CBC
HCT: 33.3 % — ABNORMAL LOW (ref 36.0–46.0)
Hemoglobin: 10.8 g/dL — ABNORMAL LOW (ref 12.0–15.0)
MCH: 29.5 pg (ref 26.0–34.0)
MCHC: 32.4 g/dL (ref 30.0–36.0)
MCV: 91 fL (ref 80.0–100.0)
Platelets: 136 10*3/uL — ABNORMAL LOW (ref 150–400)
RBC: 3.66 MIL/uL — ABNORMAL LOW (ref 3.87–5.11)
RDW: 13.1 % (ref 11.5–15.5)
WBC: 6.3 10*3/uL (ref 4.0–10.5)
nRBC: 0 % (ref 0.0–0.2)

## 2020-12-22 LAB — TROPONIN I (HIGH SENSITIVITY)
Troponin I (High Sensitivity): 22 ng/L — ABNORMAL HIGH (ref <18)
Troponin I (High Sensitivity): 24 ng/L — ABNORMAL HIGH (ref <18)

## 2020-12-22 LAB — RESP PANEL BY RT-PCR (FLU A&B, COVID) ARPGX2
Influenza A by PCR: NEGATIVE
Influenza B by PCR: NEGATIVE
SARS Coronavirus 2 by RT PCR: POSITIVE — AB

## 2020-12-22 MED ORDER — NIRMATRELVIR/RITONAVIR (PAXLOVID)TABLET: 3.0000 | ORAL_TABLET | Freq: Two times a day (BID) | ORAL | 0 refills | Status: DC

## 2020-12-22 MED ORDER — SODIUM CHLORIDE 0.9 % IV BOLUS
500.0000 mL | Freq: Once | INTRAVENOUS | Status: AC
  Administered 2020-12-22: 500 mL via INTRAVENOUS

## 2020-12-22 MED ORDER — NIRMATRELVIR/RITONAVIR (PAXLOVID)TABLET: 3.0000 | ORAL_TABLET | Freq: Two times a day (BID) | ORAL | 0 refills | Status: AC

## 2020-12-22 NOTE — Discharge Instructions (Signed)
Please seek medical attention for any high fevers, chest pain, shortness of breath, change in behavior, persistent vomiting, bloody stool or any other new or concerning symptoms.  

## 2020-12-22 NOTE — ED Provider Notes (Signed)
[facility] Emergency Department Provider Note  ____________________________________________   I have reviewed the triage vital signs and the nursing notes.   HISTORY  Chief Complaint Shortness of Breath   History limited by: Not Limited   HPI Theresa Carrillo is a 87 y.o. female who presents to the emergency department today with primary concern from shortness of breath as well as cough.  Patient states that she started developing some weakness yesterday.  Today she noticed worsening shortness of breath.  The weakness is also continued.  This has been accompanied by some cough.  She denies any fevers.  No known sick contacts although eats in a communal setting under independent living facility.   Records reviewed. Per medical record review patient has a history of CHF, HTN.  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   Stroke ([place]) 12/06/2017   Chronic bilateral low back pain with bilateral sciatica 11/11/2017   Lumbar facet arthropathy 11/11/2017   Foraminal stenosis of lumbar region 11/11/2017   Lumbar disc herniation 11/11/2017   Chronic pain syndrome 11/11/2017   Chronic diastolic heart failure ([place]) 13/09/6576   Acute systolic CHF (congestive heart failure) ([place]) 06/27/2016   AF (paroxysmal atrial fibrillation) ([place]) 06/27/2016   Anxiety 06/27/2016   HTN (hypertension) 06/27/2016   HLD (hyperlipidemia) 06/27/2016    Past Surgical History:  Procedure Laterality Date   ABDOMINAL HYSTERECTOMY     CATARACT EXTRACTION W/PHACO Right 07/31/2017   Procedure: CATARACT EXTRACTION PHACO AND INTRAOCULAR LENS PLACEMENT  (IOC);  Surgeon: Birder Robson, MD;  Location: [facility] ORS;  Service: Ophthalmology;  Laterality: Right;  [place] 00:33.8 AP% 15.7 CDE 5.28 Fluid pack lot # [id number]   CATARACT EXTRACTION W/PHACO Left 08/26/2017   Procedure: CATARACT EXTRACTION PHACO AND INTRAOCULAR LENS PLACEMENT (IOC);  Surgeon: Birder Robson, MD;  Location: [facility] ORS;  Service: Ophthalmology;  Laterality: Left;  [place] 00:38 AP% 15.3 CDE 5.95 Fluid pack lot # [id number]   FRACTURE SURGERY     HIP FRACTURE SURGERY     JOINT REPLACEMENT     REPLACEMENT TOTAL KNEE BILATERAL      Prior to Admission medications   Medication Sig Start Date End Date Taking? Authorizing Provider  albuterol (PROVENTIL HFA;VENTOLIN HFA) 108 (90 Base) MCG/ACT inhaler Inhale 2 puffs into the lungs every 6 (six) hours as needed for wheezing or shortness of breath. Patient not taking: Reported on 02/06/2018 03/10/17   Arta Silence, MD  Ascorbic Acid (VITAMIN C) 1000 MG tablet Take 1,000 mg by mouth daily.    [provider]  atorvastatin (LIPITOR) 40 MG tablet Take 1 tablet (40 mg total) by mouth daily at 6 PM. 12/07/17   Rinehuls, Early Chars, PA-C  CALCIUM PO Take 1 tablet by mouth daily.    [provider]  Cholecalciferol (VITAMIN D3) 1000 units CAPS Take 1,000 Units by mouth daily.    [provider]  clobetasol (TEMOVATE) 0.05 % external solution Apply 1 application topically 2 (two) times daily. Apply twice daily to scalp, ears, behind ears. Apply twice daily to  spot at right arm for up to 2 weeks. Avoid applying to face, groin, and axilla. Use as directed. Risk of skin atrophy with long-term use reviewed. 08/01/20   Moye, [place], MD  doxepin (SINEQUAN) 10 MG capsule Take 10-20 mg by mouth See admin instructions. Take one tablet (10 mg) by mouth twice daily - 5am and 2pm, take 2 tablets (20 mg) at bedtime    [provider]  furosemide (LASIX) 20 MG tablet Take 20 mg by mouth daily. 01/04/17   [provider]  losartan (COZAAR) 25 MG tablet Take 25 mg by mouth daily.  05/18/16   [provider]  metoprolol tartrate (LOPRESSOR) 25 MG tablet Take 1 tablet (25 mg total) by mouth 2 (two) times daily. Patient taking differently: Take 25 mg by mouth See admin instructions. Take one tablet (25 mg) by mouth twice daily - 5am and 2pm 06/28/16   Bettey Costa, MD  Multiple Vitamin (MULTIVITAMIN WITH MINERALS) TABS tablet Take 1 tablet by mouth daily.    [provider]  Omega-3 Fatty Acids (FISH OIL OMEGA-3 PO) Take 1 capsule by mouth daily.    [provider]  sotalol (BETAPACE) 80 MG tablet Take 1 tablet (80 mg total) by mouth daily. Patient taking differently: Take 80 mg by mouth See admin instructions. Take one tablet (80 mg) by mouth twice daily - 5am and 2pm 06/28/16   Bettey Costa, MD  triamcinolone cream (KENALOG) 0.1 % Apply 1 application topically at bedtime. For psoriasis    [provider]  warfarin (COUMADIN) 1 MG tablet Take 3 mg by mouth See admin instructions. Take three tablets (3 mg) by mouth at 2pm on Saturday and Sunday 04/20/16   [provider]  warfarin (COUMADIN) 4 MG tablet Take 4 mg by mouth See admin instructions. Take one tablet (4 mg) by mouth at 2pm on Monday, Tuesday, Wednesday, Thursday, Friday 04/20/16   [provider]    Allergies Patient has no known allergies.  Family History  Problem Relation Age of Onset   Heart failure Mother    Asthma Father     Social History Social History   Tobacco Use   Smoking status: Former    Packs/day: 0.25    Years: 20.00    Pack years: 5.00    Types: Cigarettes   Smokeless tobacco: Never   Tobacco comments:    social  Scientific laboratory technician Use: Never used  Substance Use Topics   Alcohol use: No   Drug use: No    Review of Systems Constitutional: No fever/chills. Positive for generalized weakness. Eyes: No visual changes. ENT: No sore throat. Cardiovascular: Denies chest  pain. Respiratory: Positive for cough, shortness of breath. Gastrointestinal: No abdominal pain.  No nausea, no vomiting.  No diarrhea.   Genitourinary: Negative for dysuria. Musculoskeletal: Negative for back pain. Skin: Negative for rash. Neurological: Negative for headaches, focal weakness or numbness.  ____________________________________________   PHYSICAL EXAM:  VITAL SIGNS: ED Triage Vitals  Enc Vitals Group     BP 12/22/20 1405 (!) 155/72     Pulse Rate 12/22/20 1404 60     Resp 12/22/20 1404 20     Temp 12/22/20 1404 98.9 F (37.2 C)     Temp Source 12/22/20 1404 Oral     SpO2 12/22/20 1404 95 %     Weight 12/22/20 1401 133 lb (60.3 kg)     Height 12/22/20 1401 5' (1.524 m)     Head  Circumference --      Peak Flow --      Pain Score 12/22/20 1401 0   Constitutional: Alert and oriented.  Eyes: Conjunctivae are normal.  ENT      Head: Normocephalic and atraumatic.      Nose: No congestion/rhinnorhea.      Mouth/Throat: Mucous membranes are moist.      Neck: No stridor. Hematological/Lymphatic/Immunilogical: No cervical lymphadenopathy. Cardiovascular: Normal rate, irregular rhythm.  No murmurs, rubs, or gallops.  Respiratory: Normal respiratory effort without tachypnea nor retractions. Breath sounds are clear and equal bilaterally. No wheezes/rales/rhonchi. Gastrointestinal: Soft and non tender. No rebound. No guarding.  Genitourinary: Deferred Musculoskeletal: Normal range of motion in all extremities. No lower extremity edema. Neurologic:  Normal speech and language. No gross focal neurologic deficits are appreciated.  Skin:  Skin is warm, dry and intact. No rash noted. Psychiatric: Mood and affect are normal. Speech and behavior are normal. Patient exhibits appropriate insight and judgment.  ____________________________________________    LABS (pertinent positives/negatives)  Trop hs 24 CBC wbc 6.3, hgb 10.8, plt 136 BMP na 132, k 4.0, glu 90, cr  0.88  ____________________________________________   EKG  I, Nance Pear, attending physician, personally viewed and interpreted this EKG  EKG Time: 1403 Rate: 58 Rhythm: atrial fibrillation Axis: normal Intervals: qtc 492 QRS: RBBB ST changes: no st elevation Impression: abnormal ekg   ____________________________________________    RADIOLOGY  CXR Small right pleural effusion  ____________________________________________   PROCEDURES  Procedures  ____________________________________________   INITIAL IMPRESSION / ASSESSMENT AND PLAN / ED COURSE  Pertinent labs & imaging results that were available during my care of the patient were reviewed by me and considered in my medical decision making (see chart for details).   Patient presented to the emergency department today because of weakness and some shortness of breath and cough.  The patient was found to have COVID here.  I do think this could explain the patient's symptoms.  Chest x-ray without any concerning pneumonia.  While troponin was minimally elevated there was no significant change on repeat.  At this time I doubt ACS.  Patient did not have any desaturations here and we got her up.  At this time I do think outpatient treatment for COVID is reasonable.  Will give patient prescription for Paxil of it.  Discussed findings and plan with patient.  ____________________________________________   FINAL CLINICAL IMPRESSION(S) / ED DIAGNOSES  Final diagnoses:  SOB (shortness of breath)  COVID-19     Note: This dictation was prepared with Dragon dictation. Any transcriptional errors that result from this process are unintentional     Nance Pear, MD 12/22/20 2324

## 2020-12-22 NOTE — ED Notes (Signed)
Road test passed. Spo2 @ 97%. Pt reports feeling at baseline and think she will be good going home. MD aware

## 2020-12-22 NOTE — ED Triage Notes (Signed)
Pt to ED [facility] from [facility] c/o shob and cough that started yesterday,

## 2021-01-11 ENCOUNTER — Other Ambulatory Visit: Payer: Self-pay | Admitting: Internal Medicine

## 2021-01-11 DIAGNOSIS — R079 Chest pain, unspecified: Secondary | ICD-10-CM

## 2021-01-11 DIAGNOSIS — J90 Pleural effusion, not elsewhere classified: Secondary | ICD-10-CM

## 2021-01-15 ENCOUNTER — Emergency Department: Payer: Medicare Other

## 2021-01-15 ENCOUNTER — Observation Stay
Admission: EM | Admit: 2021-01-15 | Discharge: 2021-01-16 | Disposition: A | Discharge status: Home / Self Care | Payer: Medicare Other | Attending: Internal Medicine | Admitting: Internal Medicine

## 2021-01-15 ENCOUNTER — Ambulatory Visit: Payer: Medicare Other | Attending: Internal Medicine

## 2021-01-15 ENCOUNTER — Ambulatory Visit: Admission: RE | Admit: 2021-01-15 | Payer: Medicare Other | Source: Ambulatory Visit

## 2021-01-15 ENCOUNTER — Other Ambulatory Visit: Payer: Self-pay

## 2021-01-15 DIAGNOSIS — Z7901 Long term (current) use of anticoagulants: Secondary | ICD-10-CM | POA: Insufficient documentation

## 2021-01-15 DIAGNOSIS — N1831 Chronic kidney disease, stage 3a: Secondary | ICD-10-CM | POA: Insufficient documentation

## 2021-01-15 DIAGNOSIS — E785 Hyperlipidemia, unspecified: Secondary | ICD-10-CM

## 2021-01-15 DIAGNOSIS — R55 Syncope and collapse: Secondary | ICD-10-CM | POA: Diagnosis present

## 2021-01-15 DIAGNOSIS — I5032 Chronic diastolic (congestive) heart failure: Secondary | ICD-10-CM | POA: Diagnosis not present

## 2021-01-15 DIAGNOSIS — I5043 Acute on chronic combined systolic (congestive) and diastolic (congestive) heart failure: Secondary | ICD-10-CM | POA: Diagnosis not present

## 2021-01-15 DIAGNOSIS — R9431 Abnormal electrocardiogram [ECG] [EKG]: Secondary | ICD-10-CM | POA: Diagnosis not present

## 2021-01-15 DIAGNOSIS — I482 Chronic atrial fibrillation, unspecified: Secondary | ICD-10-CM | POA: Diagnosis not present

## 2021-01-15 DIAGNOSIS — Z96653 Presence of artificial knee joint, bilateral: Secondary | ICD-10-CM | POA: Insufficient documentation

## 2021-01-15 DIAGNOSIS — N183 Chronic kidney disease, stage 3 unspecified: Secondary | ICD-10-CM | POA: Diagnosis present

## 2021-01-15 DIAGNOSIS — Z87891 Personal history of nicotine dependence: Secondary | ICD-10-CM | POA: Insufficient documentation

## 2021-01-15 DIAGNOSIS — I130 Hypertensive heart and chronic kidney disease with heart failure and stage 1 through stage 4 chronic kidney disease, or unspecified chronic kidney disease: Secondary | ICD-10-CM | POA: Insufficient documentation

## 2021-01-15 DIAGNOSIS — I639 Cerebral infarction, unspecified: Secondary | ICD-10-CM | POA: Diagnosis present

## 2021-01-15 DIAGNOSIS — R778 Other specified abnormalities of plasma proteins: Secondary | ICD-10-CM

## 2021-01-15 DIAGNOSIS — Z79899 Other long term (current) drug therapy: Secondary | ICD-10-CM | POA: Insufficient documentation

## 2021-01-15 DIAGNOSIS — E876 Hypokalemia: Secondary | ICD-10-CM | POA: Insufficient documentation

## 2021-01-15 DIAGNOSIS — E871 Hypo-osmolality and hyponatremia: Secondary | ICD-10-CM | POA: Diagnosis present

## 2021-01-15 DIAGNOSIS — U071 COVID-19: Secondary | ICD-10-CM | POA: Diagnosis not present

## 2021-01-15 DIAGNOSIS — Z8673 Personal history of transient ischemic attack (TIA), and cerebral infarction without residual deficits: Secondary | ICD-10-CM | POA: Insufficient documentation

## 2021-01-15 DIAGNOSIS — I10 Essential (primary) hypertension: Secondary | ICD-10-CM

## 2021-01-15 LAB — TROPONIN I (HIGH SENSITIVITY)
Troponin I (High Sensitivity): 22 ng/L — ABNORMAL HIGH (ref <18)
Troponin I (High Sensitivity): 22 ng/L — ABNORMAL HIGH (ref <18)
Troponin I (High Sensitivity): 20 ng/L — ABNORMAL HIGH (ref <18)

## 2021-01-15 LAB — BASIC METABOLIC PANEL
Anion gap: 10 (ref 5–15)
Anion gap: 7 (ref 5–15)
Anion gap: 5 (ref 5–15)
BUN: 19 mg/dL (ref 8–23)
BUN: 17 mg/dL (ref 8–23)
BUN: 14 mg/dL (ref 8–23)
CO2: 32 mmol/L (ref 22–32)
CO2: 34 mmol/L — ABNORMAL HIGH (ref 22–32)
CO2: 33 mmol/L — ABNORMAL HIGH (ref 22–32)
Calcium: 8.1 mg/dL — ABNORMAL LOW (ref 8.9–10.3)
Calcium: 8.3 mg/dL — ABNORMAL LOW (ref 8.9–10.3)
Calcium: 8.3 mg/dL — ABNORMAL LOW (ref 8.9–10.3)
Chloride: 85 mmol/L — ABNORMAL LOW (ref 98–111)
Chloride: 87 mmol/L — ABNORMAL LOW (ref 98–111)
Chloride: 93 mmol/L — ABNORMAL LOW (ref 98–111)
Creatinine, Ser: 1.02 mg/dL — ABNORMAL HIGH (ref 0.44–1.00)
Creatinine, Ser: 0.76 mg/dL (ref 0.44–1.00)
Creatinine, Ser: 0.74 mg/dL (ref 0.44–1.00)
GFR, Estimated: 53 mL/min — ABNORMAL LOW (ref >60)
GFR, Estimated: >60 mL/min (ref >60)
GFR, Estimated: >60 mL/min (ref >60)
Glucose, Bld: 107 mg/dL — ABNORMAL HIGH (ref 70–99)
Glucose, Bld: 103 mg/dL — ABNORMAL HIGH (ref 70–99)
Glucose, Bld: 90 mg/dL (ref 70–99)
Potassium: 2.6 mmol/L — CL (ref 3.5–5.1)
Potassium: 3 mmol/L — ABNORMAL LOW (ref 3.5–5.1)
Potassium: 3.9 mmol/L (ref 3.5–5.1)
Sodium: 127 mmol/L — ABNORMAL LOW (ref 135–145)
Sodium: 128 mmol/L — ABNORMAL LOW (ref 135–145)
Sodium: 131 mmol/L — ABNORMAL LOW (ref 135–145)

## 2021-01-15 LAB — URINALYSIS, ROUTINE W REFLEX MICROSCOPIC
Bilirubin Urine: NEGATIVE
Glucose, UA: NEGATIVE mg/dL
Hgb urine dipstick: NEGATIVE
Ketones, ur: NEGATIVE mg/dL
Leukocytes,Ua: NEGATIVE
Nitrite: NEGATIVE
Protein, ur: NEGATIVE mg/dL
Specific Gravity, Urine: 1.015 (ref 1.005–1.030)
pH: 8 (ref 5.0–8.0)

## 2021-01-15 LAB — CBC
HCT: 31.6 % — ABNORMAL LOW (ref 36.0–46.0)
Hemoglobin: 10.7 g/dL — ABNORMAL LOW (ref 12.0–15.0)
MCH: 30.1 pg (ref 26.0–34.0)
MCHC: 33.9 g/dL (ref 30.0–36.0)
MCV: 88.8 fL (ref 80.0–100.0)
Platelets: 158 10*3/uL (ref 150–400)
RBC: 3.56 MIL/uL — ABNORMAL LOW (ref 3.87–5.11)
RDW: 13.2 % (ref 11.5–15.5)
WBC: 5.7 10*3/uL (ref 4.0–10.5)
nRBC: 0 % (ref 0.0–0.2)

## 2021-01-15 LAB — RESP PANEL BY RT-PCR (FLU A&B, COVID) ARPGX2
Influenza A by PCR: NEGATIVE
Influenza B by PCR: NEGATIVE
SARS Coronavirus 2 by RT PCR: POSITIVE — AB

## 2021-01-15 LAB — BRAIN NATRIURETIC PEPTIDE: B Natriuretic Peptide: 618.3 pg/mL — ABNORMAL HIGH (ref 0.0–100.0)

## 2021-01-15 LAB — SODIUM, URINE, RANDOM: Sodium, Ur: 67 mmol/L

## 2021-01-15 LAB — OSMOLALITY: Osmolality: 280 mOsm/kg (ref 275–295)

## 2021-01-15 LAB — OSMOLALITY, URINE: Osmolality, Ur: 254 mOsm/kg — ABNORMAL LOW (ref 300–900)

## 2021-01-15 LAB — MAGNESIUM: Magnesium: 2.2 mg/dL (ref 1.7–2.4)

## 2021-01-15 MED ORDER — ASCORBIC ACID 500 MG PO TABS
1000.0000 mg | ORAL_TABLET | Freq: Every day | ORAL | Status: DC
  Administered 2021-01-15: 1000 mg via ORAL
  Filled 2021-01-15: qty 2

## 2021-01-15 MED ORDER — IOHEXOL 350 MG/ML SOLN
75.0000 mL | Freq: Once | INTRAVENOUS | Status: AC | PRN
  Administered 2021-01-15: 75 mL via INTRAVENOUS

## 2021-01-15 MED ORDER — GABAPENTIN 100 MG PO CAPS: 100.0000 mg | ORAL_CAPSULE | Freq: Every evening | ORAL | Status: DC | PRN

## 2021-01-15 MED ORDER — POTASSIUM CHLORIDE 10 MEQ/100ML IV SOLN: 10.0000 meq | INTRAVENOUS | Status: DC

## 2021-01-15 MED ORDER — POTASSIUM CHLORIDE 10 MEQ/100ML IV SOLN
10.0000 meq | INTRAVENOUS | Status: AC
  Administered 2021-01-15 (×3): 10 meq via INTRAVENOUS
  Filled 2021-01-15 (×3): qty 100

## 2021-01-15 MED ORDER — SODIUM CHLORIDE 0.9 % IV BOLUS
250.0000 mL | Freq: Once | INTRAVENOUS | Status: AC
  Administered 2021-01-15: 250 mL via INTRAVENOUS

## 2021-01-15 MED ORDER — NEOMYCIN-POLYMYXIN-DEXAMETH 3.5-10000-0.1 OP SUSP
1.0000 [drp] | Freq: Every day | OPHTHALMIC | Status: DC
  Filled 2021-01-15: qty 5

## 2021-01-15 MED ORDER — METOPROLOL SUCCINATE ER 50 MG PO TB24
25.0000 mg | ORAL_TABLET | Freq: Every day | ORAL | Status: DC
  Administered 2021-01-15: 25 mg via ORAL
  Filled 2021-01-15: qty 1

## 2021-01-15 MED ORDER — VITAMIN D 25 MCG (1000 UNIT) PO TABS
1000.0000 [IU] | ORAL_TABLET | Freq: Every day | ORAL | Status: DC
Start: 2021-01-15 — End: 2021-01-16
  Administered 2021-01-15: 1000 [IU] via ORAL
  Filled 2021-01-15: qty 1

## 2021-01-15 MED ORDER — ADULT MULTIVITAMIN W/MINERALS CH
1.0000 | ORAL_TABLET | Freq: Every day | ORAL | Status: DC
Start: 2021-01-15 — End: 2021-01-16
  Administered 2021-01-15: 1 via ORAL
  Filled 2021-01-15: qty 1

## 2021-01-15 MED ORDER — DOXEPIN HCL 10 MG PO CAPS
20.0000 mg | ORAL_CAPSULE | Freq: Every day | ORAL | Status: DC
  Administered 2021-01-15: 20 mg via ORAL
  Filled 2021-01-15 (×2): qty 2

## 2021-01-15 MED ORDER — CYCLOSPORINE 0.05 % OP EMUL
1.0000 [drp] | Freq: Two times a day (BID) | OPHTHALMIC | Status: DC
Start: 2021-01-15 — End: 2021-01-16
  Administered 2021-01-15: 1 [drp] via OPHTHALMIC
  Filled 2021-01-15 (×4): qty 30

## 2021-01-15 MED ORDER — APIXABAN 2.5 MG PO TABS
2.5000 mg | ORAL_TABLET | Freq: Two times a day (BID) | ORAL | Status: DC
  Administered 2021-01-15 (×2): 2.5 mg via ORAL
  Filled 2021-01-15 (×4): qty 1

## 2021-01-15 MED ORDER — SODIUM CHLORIDE 0.9 % IV SOLN: INTRAVENOUS | Status: DC

## 2021-01-15 MED ORDER — ATORVASTATIN CALCIUM 20 MG PO TABS
40.0000 mg | ORAL_TABLET | Freq: Every day | ORAL | Status: DC
  Administered 2021-01-15: 40 mg via ORAL
  Filled 2021-01-15: qty 2

## 2021-01-15 MED ORDER — OYSTER SHELL CALCIUM/D3 500-5 MG-MCG PO TABS
1.0000 | ORAL_TABLET | Freq: Every day | ORAL | Status: DC
  Administered 2021-01-15: 1 via ORAL
  Filled 2021-01-15: qty 1

## 2021-01-15 MED ORDER — NYSTATIN 100000 UNIT/GM EX POWD
1.0000 "application " | Freq: Two times a day (BID) | CUTANEOUS | Status: DC
  Administered 2021-01-15: 1 via TOPICAL
  Filled 2021-01-15: qty 15

## 2021-01-15 MED ORDER — HYDRALAZINE HCL 20 MG/ML IJ SOLN: 5.0000 mg | INTRAMUSCULAR | Status: DC | PRN

## 2021-01-15 MED ORDER — SENNOSIDES-DOCUSATE SODIUM 8.6-50 MG PO TABS: 1.0000 | ORAL_TABLET | Freq: Every evening | ORAL | Status: DC | PRN

## 2021-01-15 MED ORDER — POTASSIUM CHLORIDE CRYS ER 20 MEQ PO TBCR
40.0000 meq | EXTENDED_RELEASE_TABLET | Freq: Once | ORAL | Status: AC
  Administered 2021-01-15: 40 meq via ORAL
  Filled 2021-01-15: qty 2

## 2021-01-15 MED ORDER — CLOBETASOL PROPIONATE 0.05 % EX CREA
1.0000 "application " | TOPICAL_CREAM | Freq: Two times a day (BID) | CUTANEOUS | Status: DC
  Administered 2021-01-15: 1 via TOPICAL
  Filled 2021-01-15: qty 15

## 2021-01-15 MED ORDER — POTASSIUM CHLORIDE CRYS ER 20 MEQ PO TBCR
40.0000 meq | EXTENDED_RELEASE_TABLET | ORAL | Status: AC
  Administered 2021-01-15 (×2): 40 meq via ORAL
  Filled 2021-01-15 (×2): qty 2

## 2021-01-15 MED ORDER — ALBUTEROL SULFATE HFA 108 (90 BASE) MCG/ACT IN AERS
2.0000 | INHALATION_SPRAY | RESPIRATORY_TRACT | Status: DC | PRN
  Filled 2021-01-15: qty 6.7

## 2021-01-15 MED ORDER — DIPHENHYDRAMINE HCL 25 MG PO CAPS: 25.0000 mg | ORAL_CAPSULE | Freq: Four times a day (QID) | ORAL | Status: DC | PRN

## 2021-01-15 MED ORDER — APIXABAN 5 MG PO TABS: 5.0000 mg | ORAL_TABLET | Freq: Two times a day (BID) | ORAL | Status: DC

## 2021-01-15 MED ORDER — DOXEPIN HCL 10 MG PO CAPS
10.0000 mg | ORAL_CAPSULE | ORAL | Status: DC
  Administered 2021-01-15 – 2021-01-16 (×2): 10 mg via ORAL
  Filled 2021-01-15 (×4): qty 1

## 2021-01-15 MED ORDER — OMEGA-3-ACID ETHYL ESTERS 1 G PO CAPS
1.0000 | ORAL_CAPSULE | Freq: Every day | ORAL | Status: DC
  Administered 2021-01-15: 1 g via ORAL
  Filled 2021-01-15: qty 1

## 2021-01-15 MED ORDER — DM-GUAIFENESIN ER 30-600 MG PO TB12: 1.0000 | ORAL_TABLET | Freq: Two times a day (BID) | ORAL | Status: DC | PRN

## 2021-01-15 MED ORDER — KETOCONAZOLE 2 % EX CREA
1.0000 "application " | TOPICAL_CREAM | Freq: Two times a day (BID) | CUTANEOUS | Status: DC
  Administered 2021-01-15: 1 via TOPICAL
  Filled 2021-01-15: qty 15

## 2021-01-15 MED ORDER — ACETAMINOPHEN 325 MG PO TABS: 650.0000 mg | ORAL_TABLET | Freq: Four times a day (QID) | ORAL | Status: DC | PRN

## 2021-01-15 NOTE — ED Notes (Signed)
Pt taken to restroom, reported she was dizzy when standing. Pt sitting in recliner with daughter at bedside. Pt's daughter requesting to speak to MD.

## 2021-01-15 NOTE — ED Triage Notes (Signed)
FIRST NURSE NOTE:  Pt arrived via [facility] from [facility] with reports of syncopal episode, per EMS pt does not know why she fell, but remembers falling, per EMS, pt denied hitting her head and denied pain.  Pt is on Eliquis for afib, Per EMS pt has RBBB which may be a new finding. Pt due to see Dr. Clayborn Bigness at [place].  CBG 125, 97% RA

## 2021-01-15 NOTE — ED Provider Notes (Signed)
[facility] Emergency Department Provider Note  ____________________________________________  Time seen: Approximately 3:23 AM  I have reviewed the triage vital signs and the nursing notes.   HISTORY  Chief Complaint Loss of Consciousness   HPI Theresa Carrillo is a 87 y.o. female with a history of diastolic CHF, atrial fibrillation on Eliquis, CKD, hypertension, CVA who presents for evaluation of a syncopal event.  Patient reports that she was in her usual state of health.  She woke up in the middle of the night and was going to the kitchen when she started feeling lightheaded.  She started lowering herself to the floor when she fully lost consciousness and collapsed.  She denies any injuries from the collapse.  She reports that she crawled on the floor to her room where her emergency button was.  She denies ever passing out before.  Of note patient was diagnosed with COVID 3 weeks ago.  Had mild symptoms and did not require any admission for it.  She also has had her diuretics increased recently for worsening shortness of breath and leg swelling.  Patient denies any chest pain or shortness of breath at this time, she denies any vomiting or diarrhea, no abdominal pain, no fever or chills.   Past Medical History:  Diagnosis Date   Anxiety    Arthritis    Atrial fibrillation (HCC)    CHF (congestive heart failure) ([place])    Chronic kidney disease    RENAL INSUFF   Depression    Dysrhythmia    HLD (hyperlipidemia)    Hypertension    Mitral valve stenosis    Stroke [facility])     Patient Active Problem List   Diagnosis Date Noted   Syncope 01/15/2021   Stroke (cerebrum) ([place]) 12/07/2017   Stroke ([place]) 12/06/2017   Chronic bilateral low back pain with bilateral sciatica 11/11/2017   Lumbar facet arthropathy 11/11/2017   Foraminal stenosis of lumbar region 11/11/2017   Lumbar disc herniation 11/11/2017   Chronic pain syndrome 11/11/2017   Chronic diastolic  heart failure ([place]) 85/63/1497   Acute systolic CHF (congestive heart failure) ([place]) 06/27/2016   AF (paroxysmal atrial fibrillation) ([place]) 06/27/2016   Anxiety 06/27/2016   HTN (hypertension) 06/27/2016   HLD (hyperlipidemia) 06/27/2016    Past Surgical History:  Procedure Laterality Date   ABDOMINAL HYSTERECTOMY     CATARACT EXTRACTION W/PHACO Right 07/31/2017   Procedure: CATARACT EXTRACTION PHACO AND INTRAOCULAR LENS PLACEMENT ([place]);  Surgeon: Birder Robson, MD;  Location: [facility] ORS;  Service: Ophthalmology;  Laterality: Right;  [place] 00:33.8 AP% 15.7 CDE 5.28 Fluid pack lot # [id number]   CATARACT EXTRACTION W/PHACO Left 08/26/2017   Procedure: CATARACT EXTRACTION PHACO AND INTRAOCULAR LENS PLACEMENT ([facility]);  Surgeon: Birder Robson, MD;  Location: [facility] ORS;  Service: Ophthalmology;  Laterality: Left;  [place] 00:38 AP% 15.3 CDE 5.95 Fluid pack lot # [id number]   FRACTURE SURGERY     HIP FRACTURE SURGERY     JOINT REPLACEMENT     REPLACEMENT TOTAL KNEE BILATERAL      Prior to Admission medications   Medication Sig Start Date End Date Taking? Authorizing Provider  albuterol (PROVENTIL HFA;VENTOLIN HFA) 108 (90 Base) MCG/ACT inhaler Inhale 2 puffs into the lungs every 6 (six) hours as needed for wheezing or shortness of breath. 03/10/17  Yes Arta Silence, MD  Ascorbic Acid (VITAMIN C) 1000 MG tablet Take 1,000 mg by mouth daily.   Yes [provider]  atorvastatin (LIPITOR) 40 MG tablet  Take 1 tablet (40 mg total) by mouth daily at 6 PM. 12/07/17  Yes Rinehuls, Early Chars, PA-C  CALCIUM PO Take 1 tablet by mouth daily.   Yes [provider]  Cholecalciferol (VITAMIN D3) 1000 units CAPS Take 1,000 Units by mouth daily.   Yes [provider]  clobetasol (TEMOVATE) 0.05 % external solution Apply 1 application topically 2 (two) times daily. Apply twice daily to scalp, ears, behind ears. Apply twice daily to spot at right arm for up to 2 weeks. Avoid applying to  face, groin, and axilla. Use as directed. Risk of skin atrophy with long-term use reviewed. 08/01/20  Yes Moye, [place], MD  doxepin (SINEQUAN) 10 MG capsule Take 10-20 mg by mouth See admin instructions. Take one tablet (10 mg) by mouth twice daily - 5am and 2pm, take 2 tablets (20 mg) at bedtime   Yes [provider]  ELIQUIS 5 MG TABS tablet Take 5 mg by mouth 2 (two) times daily. 12/27/20  Yes [provider]  fluticasone (FLONASE) 50 MCG/ACT nasal spray Place 1 spray into both nostrils daily. 10/23/20  Yes [provider]  furosemide (LASIX) 40 MG tablet Take 80 mg by mouth 2 (two) times daily. 12/22/20  Yes [provider]  gabapentin (NEURONTIN) 100 MG capsule Take 100-200 mg by mouth at bedtime as needed. 12/27/20  Yes [provider]  isosorbide mononitrate (IMDUR) 30 MG 24 hr tablet Take 30 mg by mouth daily. 12/29/20  Yes [provider]  ketoconazole (NIZORAL) 2 % cream Apply 1 application topically 2 (two) times daily. 12/27/20  Yes [provider]  losartan (COZAAR) 25 MG tablet Take 25 mg by mouth daily.  05/18/16  Yes [provider]  metolazone (ZAROXOLYN) 2.5 MG tablet Take 2.5 mg by mouth daily. 01/10/21  Yes [provider]  metoprolol succinate (TOPROL-XL) 50 MG 24 hr tablet Take 25 mg by mouth daily. 10/26/20  Yes [provider]  Multiple Vitamin (MULTIVITAMIN WITH MINERALS) TABS tablet Take 1 tablet by mouth daily.   Yes [provider]  neomycin-polymyxin-dexamethasone (MAXITROL) 0.1 % ophthalmic suspension Place 1 drop into both eyes daily. 08/25/18  Yes [provider]  nystatin (MYCOSTATIN/NYSTOP) powder Apply 1 application topically 2 (two) times daily. 02/01/20  Yes [provider]  Omega-3 Fatty Acids (FISH OIL OMEGA-3 PO) Take 1 capsule by mouth daily.   Yes [provider]  RESTASIS 0.05 % ophthalmic emulsion Place 1 drop into both eyes 2 (two) times  daily. 12/27/20  Yes [provider]  SARNA lotion Apply 1 application topically as needed. 11/29/20  Yes [provider]  sotalol (BETAPACE) 80 MG tablet Take 1 tablet (80 mg total) by mouth daily. Patient taking differently: Take 80 mg by mouth See admin instructions. Take one tablet (80 mg) by mouth twice daily - 5am and 2pm 06/28/16  Yes Bettey Costa, MD    Allergies Patient has no known allergies.  Family History  Problem Relation Age of Onset   Heart failure Mother    Asthma Father     Social History Social History   Tobacco Use   Smoking status: Former    Packs/day: 0.25    Years: 20.00    Pack years: 5.00    Types: Cigarettes   Smokeless tobacco: Never   Tobacco comments:    social  Scientific laboratory technician Use: Never used  Substance Use Topics   Alcohol use: No   Drug use: No  Review of Systems  Constitutional: Negative for fever. + syncope Eyes: Negative for visual changes. ENT: Negative for sore throat. Neck: No neck pain  Cardiovascular: Negative for chest pain. Respiratory: Negative for shortness of breath. Gastrointestinal: Negative for abdominal pain, vomiting or diarrhea. Genitourinary: Negative for dysuria. Musculoskeletal: Negative for back pain. Skin: Negative for rash. Neurological: Negative for headaches, weakness or numbness. Psych: No SI or HI  ____________________________________________   PHYSICAL EXAM:  VITAL SIGNS: ED Triage Vitals  Enc Vitals Group     BP 01/15/21 0119 120/67     Pulse Rate 01/15/21 0119 (!) 116     Resp 01/15/21 0119 18     Temp 01/15/21 0119 97.7 F (36.5 C)     Temp Source 01/15/21 0119 Oral     SpO2 01/15/21 0119 97 %     Weight 01/15/21 0134 127 lb (57.6 kg)     Height 01/15/21 0134 5' (1.524 m)     Head Circumference --      Peak Flow --      Pain Score 01/15/21 0134 0     Pain Loc --      Pain Edu? --      Excl. in [place]? --     Constitutional: Alert and oriented. Well appearing and  in no apparent distress. HEENT:      Head: Normocephalic and atraumatic.         Eyes: Conjunctivae are normal. Sclera is non-icteric.       Mouth/Throat: Mucous membranes are moist.       Neck: Supple with no signs of meningismus.  No midline spine tenderness Cardiovascular: Regular rate and rhythm. No murmurs, gallops, or rubs. 2+ symmetrical distal pulses are present in all extremities. No JVD. Respiratory: Normal respiratory effort. Lungs are clear to auscultation bilaterally.  Gastrointestinal: Soft, non tender, and non distended with positive bowel sounds. No rebound or guarding. Genitourinary: No CVA tenderness. Musculoskeletal: Full painless range of motion of all her joints with no tenderness or deformities.  Pelvis is stable.  No midline spine tenderness  neurologic: Normal speech and language. Face is symmetric. Moving all extremities. No gross focal neurologic deficits are appreciated. Skin: Skin is warm, dry and intact. No rash noted. Psychiatric: Mood and affect are normal. Speech and behavior are normal.  ____________________________________________   LABS (all labs ordered are listed, but only abnormal results are displayed)  Labs Reviewed  RESP PANEL BY RT-PCR (FLU A&B, COVID) ARPGX2 - Abnormal; Notable for the following components:      Result Value   SARS Coronavirus 2 by RT PCR POSITIVE (*)    All other components within normal limits  BASIC METABOLIC PANEL - Abnormal; Notable for the following components:   Sodium 127 (*)    Potassium 2.6 (*)    Chloride 85 (*)    Glucose, Bld 107 (*)    Creatinine, Ser 1.02 (*)    Calcium 8.1 (*)    GFR, Estimated 53 (*)    All other components within normal limits  CBC - Abnormal; Notable for the following components:   RBC 3.56 (*)    Hemoglobin 10.7 (*)    HCT 31.6 (*)    All other components within normal limits  TROPONIN I (HIGH SENSITIVITY) - Abnormal; Notable for the following components:   Troponin I (High  Sensitivity) 22 (*)    All other components within normal limits  MAGNESIUM  URINALYSIS, ROUTINE W REFLEX MICROSCOPIC  CBG MONITORING, ED   ____________________________________________  EKG  ED ECG REPORT I, Rudene Re, the attending physician, personally viewed and interpreted this ECG.  Sinus bradycardia with first-degree AV block, prolonged QTC, right bundle branch block, no ST elevations or depressions ____________________________________________  RADIOLOGY  I have personally reviewed the images performed during this visit and I agree with the Radiologist's read.   Interpretation by Radiologist:  CT HEAD WO CONTRAST (5MM)  Result Date: 01/15/2021 CLINICAL DATA:  Head trauma, minor (Age >= 65y) EXAM: CT HEAD WITHOUT CONTRAST TECHNIQUE: Contiguous axial images were obtained from the base of the skull through the vertex without intravenous contrast. COMPARISON:  MRI [id number] FINDINGS: Brain: Normal anatomic configuration. Parenchymal volume loss is commensurate with the patient's age. Mild periventricular white matter changes are present likely reflecting the sequela of small vessel ischemia. Remote infarcts are noted within the left lentiform nucleus and corona radiata bilaterally. No abnormal intra or extra-axial mass lesion or fluid collection. No abnormal mass effect or midline shift. No evidence of acute intracranial hemorrhage or infarct. Ventricular size is normal. Cerebellum unremarkable. Vascular: No asymmetric hyperdense vasculature at the skull base. Skull: Intact Sinuses/Orbits: Paranasal sinuses are clear. Ocular lenses have been removed. Orbits are otherwise unremarkable. Other: Mastoid air cells and middle ear cavities are clear. IMPRESSION: No acute intracranial abnormality.  No calvarial fracture. Stable senescent change and remote infarcts Electronically Signed   By: Fidela Salisbury M.D.   On: 01/15/2021 02:14      ____________________________________________   PROCEDURES  Procedure(s) performed:yes .1-3 Lead EKG Interpretation Performed by: Rudene Re, MD Authorized by: Rudene Re, MD     Interpretation: non-specific     ECG rate assessment: normal     Rhythm: sinus rhythm     Ectopy: none     Conduction: abnormal     Critical Care performed: yes  CRITICAL CARE Performed by: Rudene Re  ?  Total critical care time: 30 min  Critical care time was exclusive of separately billable procedures and treating other patients.  Critical care was necessary to treat or prevent imminent or life-threatening deterioration.  Critical care was time spent personally by me on the following activities: development of treatment plan with patient and/or surrogate as well as nursing, discussions with consultants, evaluation of patient's response to treatment, examination of patient, obtaining history from patient or surrogate, ordering and performing treatments and interventions, ordering and review of laboratory studies, ordering and review of radiographic studies, pulse oximetry and re-evaluation of patient's condition.  ____________________________________________   INITIAL IMPRESSION / ASSESSMENT AND PLAN / ED COURSE  87 y.o. female with a history of diastolic CHF, atrial fibrillation on Eliquis, CKD, hypertension, CVA who presents for evaluation of a syncopal event. Recent increase in her diuretics and had COVID 3 weeks ago.  Luckily patient did not sustain any injury based on physical and history.  Because she is on blood thinners a head CT was done and that is negative for intracranial traumatic injury.  She is slightly tachycardic but denies chest pain or shortness of breath.  With the recent COVID diagnosis will pursue a CT angio of the chest to rule out PE as possible cause of patient's syncope.  Review of patient's chart shows that she saw her cardiologist recently and he had  ordered a CT as an outpatient due to her worsening shortness of breath since having COVID to rule out pericardial effusion or PE.  She is scheduled to undergo this test this morning.  Therefore another reason to have the CT done  here in the emergency room.  Her EKG shows right bundle branch block and a prolonged QTC.  Labs showing hypokalemia most likely due to increase in diuretics.  We will supplement p.o. and IV and keep patient on telemetry for any signs of dysrhythmias.  Given these above findings patient will require admission to the hospital for further management.   _________________________ 7:22 AM on 01/15/2021 -----------------------------------------  CTA negative for PE. Admitted to Hospitalist service for hypokalemia and syncope       _____________________________________________ Please note:  Patient was evaluated in Emergency Department today for the symptoms described in the history of present illness. Patient was evaluated in the context of the global COVID-19 pandemic, which necessitated consideration that the patient might be at risk for infection with the SARS-CoV-2 virus that causes COVID-19. Institutional protocols and algorithms that pertain to the evaluation of patients at risk for COVID-19 are in a state of rapid change based on information released by regulatory bodies including the CDC and federal and state organizations. These policies and algorithms were followed during the patient's care in the ED.  Some ED evaluations and interventions may be delayed as a result of limited staffing during the pandemic.   [place] Controlled Substance Database was reviewed by me. ____________________________________________   FINAL CLINICAL IMPRESSION(S) / ED DIAGNOSES   Final diagnoses:  Syncope, unspecified syncope type  Hypokalemia  Prolonged Q-T interval on ECG      NEW MEDICATIONS STARTED DURING THIS VISIT:  ED Discharge Orders     None        Note:  This  document was prepared using Dragon voice recognition software and may include unintentional dictation errors.    Rudene Re, MD 01/15/21 [phone]

## 2021-01-15 NOTE — H&P (Signed)
Right  History and Physical    Theresa Carrillo TSV:[id number] DOB: [birth year] DOA: 01/15/2021  Referring MD/NP/PA:   PCP: [facility]   Patient coming from:  The patient is coming from independent living facility  Chief Complaint: Syncope  HPI: Theresa Carrillo is a 87 y.o. female with medical history significant of hypertension, hyperlipidemia, stroke, depression, anxiety, mitral valve stenosis, CKD-3A, dCHF, atrial fibrillation on Eliquis, chronic back pain, who presents with syncope.  Patient states that recently her Lasix dose was adjusted by her cardiologist.  She used to be on 20 mg daily, which was increased to 40 mg daily, then increase to 80 mg in the past 3 days.  Patient states that she developed lightheadedness last night, and passed out while she was going to kitchen.  No head or neck injury.  Patient does not have unilateral numbness or tinglings in extremities.  No facial droop or slurred speech. Patient had had positive COVID test 25 days ago.  He has mild dry cough, no chest pain, shortness breath, fever or chills.  She has nausea, slightly vomited once.  No diarrhea or abdominal pain.  No symptoms of UTI.  Patient is constipated.  ED Course: pt was found to have WBC 5.7, troponin level 22, 20, BNP 618, renal function at baseline, potassium 2.6, magnesium 2.2, sodium 127, temperature normal, blood pressure 107/68, heart rate 56, 116, 59, RR 18, oxygen saturation 99% on room air.  CT of head is negative for acute intracranial abnormalities.  CT angiogram is negative for PE.  Patient is placed on progressive bed for observation  CTA of chest: 1. Cardiomegaly and CHF including small pleural effusions. 2. Negative for pulmonary embolism. 3.  Aortic Atherosclerosis (ICD10-I70.0). 4. Cholelithiasis and left nephrolithiasis.    Review of Systems:   General: no fevers, chills, no body weight gain, has poor appetite, has fatigue HEENT: no blurry vision, hearing  changes or sore throat Respiratory: no dyspnea, coughing, wheezing CV: no chest pain, no palpitations GI: no nausea, vomiting, abdominal pain, diarrhea, constipation GU: no dysuria, burning on urination, increased urinary frequency, hematuria  Ext: no, edema Neuro: no unilateral weakness, numbness, or tingling, no vision change or hearing loss Skin: no rash, no skin tear. MSK: No muscle spasm, no deformity, no limitation of range of movement in spin Heme: No easy bruising.  Travel history: No recent long distant travel.  Allergy: No Known Allergies  Past Medical History:  Diagnosis Date   Anxiety    Arthritis    Atrial fibrillation (HCC)    CHF (congestive heart failure) (HCC)    Chronic kidney disease    RENAL INSUFF   Depression    Dysrhythmia    HLD (hyperlipidemia)    Hypertension    Mitral valve stenosis    Stroke [facility])     Past Surgical History:  Procedure Laterality Date   ABDOMINAL HYSTERECTOMY     CATARACT EXTRACTION W/PHACO Right 07/31/2017   Procedure: CATARACT EXTRACTION PHACO AND INTRAOCULAR LENS PLACEMENT ([facility]);  Surgeon: Birder Robson, MD;  Location: [facility] ORS;  Service: Ophthalmology;  Laterality: Right;  [place] 00:33.8 AP% 15.7 CDE 5.28 Fluid pack lot # [id number]   CATARACT EXTRACTION W/PHACO Left 08/26/2017   Procedure: CATARACT EXTRACTION PHACO AND INTRAOCULAR LENS PLACEMENT ([facility]);  Surgeon: Birder Robson, MD;  Location: [facility] ORS;  Service: Ophthalmology;  Laterality: Left;  [place] 00:38 AP% 15.3 CDE 5.95 Fluid pack lot # [id number]   FRACTURE SURGERY     HIP FRACTURE SURGERY  JOINT REPLACEMENT     REPLACEMENT TOTAL KNEE BILATERAL      Social History:  reports that she has quit smoking. Her smoking use included cigarettes. She has a 5.00 pack-year smoking history. She has never used smokeless tobacco. She reports that she does not drink alcohol and does not use drugs.  Family History:  Family History  Problem Relation Age of Onset   Heart failure  Mother    Asthma Father      Prior to Admission medications   Medication Sig Start Date End Date Taking? Authorizing Provider  albuterol (PROVENTIL HFA;VENTOLIN HFA) 108 (90 Base) MCG/ACT inhaler Inhale 2 puffs into the lungs every 6 (six) hours as needed for wheezing or shortness of breath. 03/10/17  Yes Arta Silence, MD  Ascorbic Acid (VITAMIN C) 1000 MG tablet Take 1,000 mg by mouth daily.   Yes [provider]  atorvastatin (LIPITOR) 40 MG tablet Take 1 tablet (40 mg total) by mouth daily at 6 PM. 12/07/17  Yes Rinehuls, Early Chars, PA-C  CALCIUM PO Take 1 tablet by mouth daily.   Yes [provider]  Cholecalciferol (VITAMIN D3) 1000 units CAPS Take 1,000 Units by mouth daily.   Yes [provider]  clobetasol (TEMOVATE) 0.05 % external solution Apply 1 application topically 2 (two) times daily. Apply twice daily to scalp, ears, behind ears. Apply twice daily to spot at right arm for up to 2 weeks. Avoid applying to face, groin, and axilla. Use as directed. Risk of skin atrophy with long-term use reviewed. 08/01/20  Yes Moye, [place], MD  doxepin (SINEQUAN) 10 MG capsule Take 10-20 mg by mouth See admin instructions. Take one tablet (10 mg) by mouth twice daily - 5am and 2pm, take 2 tablets (20 mg) at bedtime   Yes [provider]  ELIQUIS 5 MG TABS tablet Take 5 mg by mouth 2 (two) times daily. 12/27/20  Yes [provider]  fluticasone (FLONASE) 50 MCG/ACT nasal spray Place 1 spray into both nostrils daily. 10/23/20  Yes [provider]  furosemide (LASIX) 40 MG tablet Take 80 mg by mouth 2 (two) times daily. 12/22/20  Yes [provider]  gabapentin (NEURONTIN) 100 MG capsule Take 100-200 mg by mouth at bedtime as needed. 12/27/20  Yes [provider]  isosorbide mononitrate (IMDUR) 30 MG 24 hr tablet Take 30 mg by mouth daily. 12/29/20  Yes [provider]  ketoconazole (NIZORAL) 2 % cream Apply 1  application topically 2 (two) times daily. 12/27/20  Yes [provider]  losartan (COZAAR) 25 MG tablet Take 25 mg by mouth daily.  05/18/16  Yes [provider]  metolazone (ZAROXOLYN) 2.5 MG tablet Take 2.5 mg by mouth daily. 01/10/21  Yes [provider]  metoprolol succinate (TOPROL-XL) 50 MG 24 hr tablet Take 25 mg by mouth daily. 10/26/20  Yes [provider]  Multiple Vitamin (MULTIVITAMIN WITH MINERALS) TABS tablet Take 1 tablet by mouth daily.   Yes [provider]  neomycin-polymyxin-dexamethasone (MAXITROL) 0.1 % ophthalmic suspension Place 1 drop into both eyes daily. 08/25/18  Yes [provider]  nystatin (MYCOSTATIN/NYSTOP) powder Apply 1 application topically 2 (two) times daily. 02/01/20  Yes [provider]  Omega-3 Fatty Acids (FISH OIL OMEGA-3 PO) Take 1 capsule by mouth daily.   Yes [provider]  RESTASIS 0.05 % ophthalmic emulsion Place 1 drop into both eyes 2 (two) times daily. 12/27/20  Yes [provider]  SARNA lotion Apply 1 application  topically as needed. 11/29/20  Yes [provider]  sotalol (BETAPACE) 80 MG tablet Take 1 tablet (80 mg total) by mouth daily. Patient taking differently: Take 80 mg by mouth See admin instructions. Take one tablet (80 mg) by mouth twice daily - 5am and 2pm 06/28/16  Yes Bettey Costa, MD    Physical Exam: Vitals:   01/15/21 1000 01/15/21 1030 01/15/21 1224 01/15/21 1500  BP: 138/68 (!) 143/67 (!) 146/57 (!) 160/72  Pulse: 64 (!) 59 (!) 59 60  Resp: 17 15 16 16   Temp:      TempSrc:      SpO2: 100% 100% 100% 100%  Weight:      Height:       General: Not in acute distress.  Dry mucous membrane.   HEENT:       Eyes: PERRL, EOMI, no scleral icterus.       ENT: No discharge from the ears and nose, no pharynx injection, no tonsillar enlargement.        Neck: No JVD, no bruit, no mass felt. Heme: No neck lymph node enlargement. Cardiac: S1/S2,  RRR, No murmurs, No gallops or rubs. Respiratory: No rales, wheezing, rhonchi or rubs. GI: Soft, nondistended, nontender, no rebound pain, no organomegaly, BS present. GU: No hematuria Ext: No pitting leg edema bilaterally. 1+DP/PT pulse bilaterally. Musculoskeletal: No joint deformities, No joint redness or warmth, no limitation of ROM in spin. Skin: No rashes.  Neuro: Alert, oriented X3, cranial nerves II-XII grossly intact, moves all extremities normally. Psych: Patient is not psychotic, no suicidal or hemocidal ideation.  Labs on Admission: I have personally reviewed following labs and imaging studies  CBC: Recent Labs  Lab 01/15/21 0130  WBC 5.7  HGB 10.7*  HCT 31.6*  MCV 88.8  PLT 269   Basic Metabolic Panel: Recent Labs  Lab 01/15/21 0130 01/15/21 0739 01/15/21 1506  NA 127* 128* 131*  K 2.6* 3.0* 3.9  CL 85* 87* 93*  CO2 32 34* 33*  GLUCOSE 107* 103* 90  BUN 19 17 14   CREATININE 1.02* 0.76 0.74  CALCIUM 8.1* 8.3* 8.3*  MG 2.2  --   --    GFR: Estimated Creatinine Clearance: 37.9 mL/min (by C-G formula based on SCr of 0.74 mg/dL). Liver Function Tests: No results for input(s): AST, ALT, ALKPHOS, BILITOT, PROT, ALBUMIN in the last 168 hours. No results for input(s): LIPASE, AMYLASE in the last 168 hours. No results for input(s): AMMONIA in the last 168 hours. Coagulation Profile: No results for input(s): INR, PROTIME in the last 168 hours. Cardiac Enzymes: No results for input(s): CKTOTAL, CKMB, CKMBINDEX, TROPONINI in the last 168 hours. BNP (last 3 results) No results for input(s): PROBNP in the last 8760 hours. HbA1C: No results for input(s): HGBA1C in the last 72 hours. CBG: No results for input(s): GLUCAP in the last 168 hours. Lipid Profile: No results for input(s): CHOL, HDL, LDLCALC, TRIG, CHOLHDL, LDLDIRECT in the last 72 hours. Thyroid Function Tests: No results for input(s): TSH, T4TOTAL, FREET4, T3FREE, THYROIDAB in the last 72 hours. Anemia  Panel: No results for input(s): VITAMINB12, FOLATE, FERRITIN, TIBC, IRON, RETICCTPCT in the last 72 hours. Urine analysis:    Component Value Date/Time   COLORURINE YELLOW 01/15/2021 [place] 01/15/2021 0739   APPEARANCEUR [place] 06/08/2013 1334   LABSPEC 1.015 01/15/2021 0739   LABSPEC 1.018 06/08/2013 1334   PHURINE 8.0 01/15/2021 0739   GLUCOSEU NEGATIVE 01/15/2021 0739   GLUCOSEU Negative 06/08/2013 1334  HGBUR NEGATIVE 01/15/2021 [place] 01/15/2021 0739   BILIRUBINUR Negative 06/08/2013 1334   [place] 01/15/2021 0739   PROTEINUR NEGATIVE 01/15/2021 0739   NITRITE NEGATIVE 01/15/2021 0739   LEUKOCYTESUR NEGATIVE 01/15/2021 0739   LEUKOCYTESUR 2+ 06/08/2013 1334   Sepsis Labs: @LABRCNTIP (procalcitonin:4,lacticidven:4) ) Recent Results (from the past 240 hour(s))  Resp Panel by RT-PCR (Flu A&B, Covid) Nasopharyngeal Swab     Status: Abnormal   Collection Time: 01/15/21  3:40 AM   Specimen: Nasopharyngeal Swab; Nasopharyngeal(NP) swabs in vial transport medium  Result Value Ref Range Status   SARS Coronavirus 2 by RT PCR POSITIVE (A) NEGATIVE Final    Comment: RESULT CALLED TO, READ BACK BY AND VERIFIED WITH: [place] 2542 01/15/21 HNM (NOTE) SARS-CoV-2 target nucleic acids are DETECTED.  The SARS-CoV-2 RNA is generally detectable in upper respiratory specimens during the acute phase of infection. Positive results are indicative of the presence of the identified virus, but do not rule out bacterial infection or co-infection with other pathogens not detected by the test. Clinical correlation with patient history and other diagnostic information is necessary to determine patient infection status. The expected result is Negative.  Fact Sheet for Patients: EntrepreneurPulse.com.au  Fact Sheet for Healthcare Providers: IncredibleEmployment.be  This test is not yet approved or cleared  by the [place] FDA and  has been authorized for detection and/or diagnosis of SARS-CoV-2 by FDA under an Emergency Use Authorization (EUA).  This EUA will remain in effect (meaning this test can be  used) for the duration of  the COVID-19 declaration under Section 564(b)(1) of the Act, 21 U.S.C. section 360bbb-3(b)(1), unless the authorization is terminated or revoked sooner.     Influenza A by PCR NEGATIVE NEGATIVE Final   Influenza B by PCR NEGATIVE NEGATIVE Final    Comment: (NOTE) The Xpert Xpress SARS-CoV-2/FLU/RSV plus assay is intended as an aid in the diagnosis of influenza from Nasopharyngeal swab specimens and should not be used as a sole basis for treatment. Nasal washings and aspirates are unacceptable for Xpert Xpress SARS-CoV-2/FLU/RSV testing.  Fact Sheet for Patients: EntrepreneurPulse.com.au  Fact Sheet for Healthcare Providers: IncredibleEmployment.be  This test is not yet approved or cleared by the [place] FDA and has been authorized for detection and/or diagnosis of SARS-CoV-2 by FDA under an Emergency Use Authorization (EUA). This EUA will remain in effect (meaning this test can be used) for the duration of the COVID-19 declaration under Section 564(b)(1) of the Act, 21 U.S.C. section 360bbb-3(b)(1), unless the authorization is terminated or revoked.  Performed at [facility], [address] [place], [place] [address]      Radiological Exams on Admission: CT HEAD WO CONTRAST (5MM)  Result Date: 01/15/2021 CLINICAL DATA:  Head trauma, minor (Age >= 65y) EXAM: CT HEAD WITHOUT CONTRAST TECHNIQUE: Contiguous axial images were obtained from the base of the skull through the vertex without intravenous contrast. COMPARISON:  MRI [id number] FINDINGS: Brain: Normal anatomic configuration. Parenchymal volume loss is commensurate with the patient's age. Mild periventricular white matter changes are present  likely reflecting the sequela of small vessel ischemia. Remote infarcts are noted within the left lentiform nucleus and corona radiata bilaterally. No abnormal intra or extra-axial mass lesion or fluid collection. No abnormal mass effect or midline shift. No evidence of acute intracranial hemorrhage or infarct. Ventricular size is normal. Cerebellum unremarkable. Vascular: No asymmetric hyperdense vasculature at the skull base. Skull: Intact Sinuses/Orbits: Paranasal sinuses are clear. Ocular lenses have  been removed. Orbits are otherwise unremarkable. Other: Mastoid air cells and middle ear cavities are clear. IMPRESSION: No acute intracranial abnormality.  No calvarial fracture. Stable senescent change and remote infarcts Electronically Signed   By: Fidela Salisbury M.D.   On: 01/15/2021 02:14   CT Angio Chest PE W and/or Wo Contrast  Result Date: 01/15/2021 CLINICAL DATA:  CHF. EXAM: CT ANGIOGRAPHY CHEST WITH CONTRAST TECHNIQUE: Multidetector CT imaging of the chest was performed using the standard protocol during bolus administration of intravenous contrast. Multiplanar CT image reconstructions and MIPs were obtained to evaluate the vascular anatomy. CONTRAST:  28mL OMNIPAQUE IOHEXOL 350 MG/ML SOLN COMPARISON:  None. FINDINGS: Cardiovascular: Satisfactory opacification of the pulmonary arteries to the segmental level. No evidence of pulmonary embolism. Enlarged heart size. No pericardial effusion. Bulky mitral annular ossification. Extensive atheromatous calcification of the aorta and coronaries. Aortic tortuosity. Mediastinum/Nodes: Negative for adenopathy or mass. Lungs/Pleura: Central airways are clear. Generalized bronchial airway thickening with mild interlobular septal thickening and small, layering pleural effusions. Mosaic attenuation of the apical lungs which is likely from small airway collapse. Upper Abdomen: Cholelithiasis. Left nephrolithiasis and renal cystic density. Musculoskeletal: Generalized  spondylosis with multi-level bridging osteophyte. Marked osteopenia. Review of the MIP images confirms the above findings. IMPRESSION: 1. Cardiomegaly and CHF including small pleural effusions. 2. Negative for pulmonary embolism. 3.  Aortic Atherosclerosis (ICD10-I70.0). 4. Cholelithiasis and left nephrolithiasis. Electronically Signed   By: Jorje Guild M.D.   On: 01/15/2021 07:35     EKG: I have personally reviewed.  Seems to be sinus rhythm, QTC 527, RAD, right bundle blockage  Assessment/Plan Principal Problem:   Syncope Active Problems:   HTN (hypertension)   HLD (hyperlipidemia)   Chronic diastolic heart failure (HCC)   Stroke (HCC)   CKD (chronic kidney disease), stage IIIa   Atrial fibrillation, chronic (HCC)   COVID-19 virus infection   Hypokalemia   Hyponatremia   Elevated troponin   Syncope: Possibly due to volume depletion.  Patient is Lasix dose was recently increased from 20 to 80 mg daily.  CT of head is negative for acute intracranial abnormalities. CT angiograms negative for PE.  -Placed on progressive bed of observation -Frequent neuro check -Hold diuretics -IV fluid: 250 cc normal saline, followed by 50 cc/h -PT/OT  HTN (hypertension) -IV hydralazine as needed -Hold sotalol due to QTC prolongation -Hold Lasix, metolazone, Cozaar, imdur due to softer blood pressure -Continue metoprolol  HLD (hyperlipidemia) -Lipitor  Chronic diastolic heart failure ([place]): BNP 618, but the patient does not have leg edema or JVD.  Clinically patient is volume depleted. -Hold Lasix and metolazone  Stroke ([place]) -Lipitor -Patient is on Eliquis for A. Fib  CKD (chronic kidney disease), stage IIIa -Follow-up with BMP  Atrial fibrillation, chronic (HCC) -Continue metoprolol and Eliquis -Hold sotalol due to QTC prolongation  COVID-19 virus infection: Patient had a positive COVID test 25 days ago.  He has very minimal cough. -As needed albuterol and  Mucinex  Hypokalemia: Potassium 2.6.  Magnesium 2.2 -Repleted potassium  Hyponatremia: Sodium 127.  Mental status okay.  Likely due to overdiuresis. -IV fluid: 250 cc normal saline, followed by 50 cc/h -f/u BMP q8h -Fluid restriction  Elevated troponin: Troponin level 22, 20, no chest pain.  Likely due to demand ischemia. -Trend troponin -check A1c, FLP -Patient is on Lipitor    DVT ppx: on Eliquis Code Status: Full code Family Communication:  Yes, patient's daughter by phone Disposition Plan:  Anticipate discharge back to previous environment, independent  living facility Consults called:   Admission status and Level of care: Progressive:    for obs      Status is: Observation  The patient remains OBS appropriate and will d/c before 2 midnights.         Date of Service 01/15/2021    Ivor Costa [facility]   If 7PM-7AM, please contact night-coverage [email or web address] 01/15/2021, 5:00 PM

## 2021-01-15 NOTE — ED Notes (Signed)
[facility] sent to the lab.  

## 2021-01-15 NOTE — ED Triage Notes (Addendum)
Pt from [facility] with syncopal episode. Pt states she was walking and then passed out. Pt denies injury. Pt states she does remember falling. Pt denies hitting head but states when she regained consciouness she was lying on the floor.

## 2021-01-16 DIAGNOSIS — R55 Syncope and collapse: Secondary | ICD-10-CM | POA: Diagnosis not present

## 2021-01-16 LAB — BASIC METABOLIC PANEL
Anion gap: 6 (ref 5–15)
BUN: 11 mg/dL (ref 8–23)
CO2: 30 mmol/L (ref 22–32)
Calcium: 8.3 mg/dL — ABNORMAL LOW (ref 8.9–10.3)
Chloride: 97 mmol/L — ABNORMAL LOW (ref 98–111)
Creatinine, Ser: 0.6 mg/dL (ref 0.44–1.00)
GFR, Estimated: >60 mL/min (ref >60)
Glucose, Bld: 118 mg/dL — ABNORMAL HIGH (ref 70–99)
Potassium: 4.3 mmol/L (ref 3.5–5.1)
Sodium: 133 mmol/L — ABNORMAL LOW (ref 135–145)

## 2021-01-16 LAB — CBC
HCT: 34.4 % — ABNORMAL LOW (ref 36.0–46.0)
Hemoglobin: 11.4 g/dL — ABNORMAL LOW (ref 12.0–15.0)
MCH: 29.8 pg (ref 26.0–34.0)
MCHC: 33.1 g/dL (ref 30.0–36.0)
MCV: 89.8 fL (ref 80.0–100.0)
Platelets: 181 10*3/uL (ref 150–400)
RBC: 3.83 MIL/uL — ABNORMAL LOW (ref 3.87–5.11)
RDW: 13.4 % (ref 11.5–15.5)
WBC: 8.1 10*3/uL (ref 4.0–10.5)
nRBC: 0 % (ref 0.0–0.2)

## 2021-01-16 LAB — LIPID PANEL
Cholesterol: 151 mg/dL (ref 0–200)
HDL: 65 mg/dL (ref >40)
LDL Cholesterol: 76 mg/dL (ref 0–99)
Total CHOL/HDL Ratio: 2.3 RATIO
Triglycerides: 48 mg/dL (ref <150)
VLDL: 10 mg/dL (ref 0–40)

## 2021-01-16 LAB — HEMOGLOBIN A1C
Hgb A1c MFr Bld: 5.6 % (ref 4.8–5.6)
Mean Plasma Glucose: 114 mg/dL

## 2021-01-16 MED ORDER — FUROSEMIDE 40 MG PO TABS: 40.0000 mg | ORAL_TABLET | Freq: Every day | ORAL | 0 refills | Status: AC

## 2021-01-16 NOTE — Progress Notes (Signed)
IVs removed from patient. Discharge instructions given. Verbalized understanding. No acute distress at this time. Patient awaiting daughter to transport her home.

## 2021-01-16 NOTE — Discharge Summary (Incomplete)
Physician Discharge Summary   Patient name: Theresa Carrillo  Admit date:     01/15/2021  Discharge date: 01/16/2021  Discharge Physician: Ezekiel Slocumb   PCP: [facility]   Recommendations at discharge:  --Follow up as scheduled with Dr. Clayborn Bigness on Thursday 12/8.   --Skip taking Lasix or metolazone today. --Tomorrow take 40 mg Lasix only until follow up   Discharge Diagnoses Principal Problem:   Syncope Active Problems:   HTN (hypertension)   HLD (hyperlipidemia)   Chronic diastolic heart failure (HCC)   Stroke (HCC)   CKD (chronic kidney disease), stage IIIa   Atrial fibrillation, chronic (HCC)   COVID-19 virus infection   Hypokalemia   Hyponatremia   Elevated troponin   Resolved Diagnoses Resolved Problems:   * No resolved hospital problems. [facility] Course   No notes on file   No new Assessment & Plan notes have been filed under this hospital service since the last note was generated. Service: Hospitalist     Procedures performed: ***   Condition at discharge: {DC Condition:26389}  Exam Physical Exam ***  Disposition: {Plan; Disposition:26390}  Discharge time: {LESS THAN/GREATER THAN:26388} 30 minutes.  Follow-up Information     Lujean Amel D, MD. Schedule an appointment as soon as possible for a visit in 1 day(s).   Specialties: Cardiology, Internal Medicine Why: Follow up for diuretic regimen after admission to hospital due to syncope on increased diuretics Contact information: [place] [address] [phone]                 Allergies as of 01/16/2021   No Known Allergies      Medication List     STOP taking these medications    metolazone 2.5 MG tablet Commonly known as: ZAROXOLYN       TAKE these medications    albuterol 108 (90 Base) MCG/ACT inhaler Commonly known as: VENTOLIN HFA Inhale 2 puffs into the lungs every 6 (six) hours as needed for wheezing or  shortness of breath.   atorvastatin 40 MG tablet Commonly known as: LIPITOR Take 1 tablet (40 mg total) by mouth daily at 6 PM.   CALCIUM PO Take 1 tablet by mouth daily.   clobetasol 0.05 % external solution Commonly known as: TEMOVATE Apply 1 application topically 2 (two) times daily. Apply twice daily to scalp, ears, behind ears. Apply twice daily to spot at right arm for up to 2 weeks. Avoid applying to face, groin, and axilla. Use as directed. Risk of skin atrophy with long-term use reviewed.   doxepin 10 MG capsule Commonly known as: SINEQUAN Take 10-20 mg by mouth See admin instructions. Take one tablet (10 mg) by mouth twice daily - 5am and 2pm, take 2 tablets (20 mg) at bedtime   Eliquis 5 MG Tabs tablet Generic drug: apixaban Take 5 mg by mouth 2 (two) times daily.   FISH OIL OMEGA-3 PO Take 1 capsule by mouth daily.   fluticasone 50 MCG/ACT nasal spray Commonly known as: FLONASE Place 1 spray into both nostrils daily.   furosemide 40 MG tablet Commonly known as: LASIX Take 1 tablet (40 mg total) by mouth daily. What changed:  how much to take when to take this   gabapentin 100 MG capsule Commonly known as: NEURONTIN Take 100-200 mg by mouth at bedtime as needed.   isosorbide mononitrate 30 MG 24 hr tablet Commonly known as: IMDUR Take 30 mg by mouth daily.  ketoconazole 2 % cream Commonly known as: NIZORAL Apply 1 application topically 2 (two) times daily.   losartan 25 MG tablet Commonly known as: COZAAR Take 25 mg by mouth daily.   metoprolol succinate 50 MG 24 hr tablet Commonly known as: TOPROL-XL Take 25 mg by mouth daily.   multivitamin with minerals Tabs tablet Take 1 tablet by mouth daily.   neomycin-polymyxin-dexamethasone 0.1 % ophthalmic suspension Commonly known as: MAXITROL Place 1 drop into both eyes daily.   nystatin powder Commonly known as: MYCOSTATIN/NYSTOP Apply 1 application topically 2 (two) times daily.   Restasis  0.05 % ophthalmic emulsion Generic drug: cycloSPORINE Place 1 drop into both eyes 2 (two) times daily.   Sarna lotion Generic drug: camphor-menthol Apply 1 application topically as needed.   sotalol 80 MG tablet Commonly known as: BETAPACE Take 1 tablet (80 mg total) by mouth daily. What changed:  when to take this additional instructions   vitamin C 1000 MG tablet Take 1,000 mg by mouth daily.   Vitamin D3 25 MCG (1000 UT) Caps Take 1,000 Units by mouth daily.        DG Chest 2 View  Result Date: 12/22/2020 CLINICAL DATA:  Shortness of breath. EXAM: CHEST - 2 VIEW COMPARISON:  December 06, 2017 FINDINGS: Borderline enlarged cardiac silhouette, similar. No consolidation. Mild diffuse interstitial prominence, which is chronic. No visible pleural effusions or pneumothorax. Right skin fold. No acute osseous abnormality. Degenerative changes of the spine with diffuse demineralization. IMPRESSION: 1. Small right pleural effusion. 2. Mild interstitial prominence, which likely is chronic given similar findings on the priors. An element of mild interstitial edema is not excluded. Electronically Signed   By: Margaretha Sheffield M.D.   On: 12/22/2020 14:33   CT HEAD WO CONTRAST (5MM)  Result Date: 01/15/2021 CLINICAL DATA:  Head trauma, minor (Age >= 65y) EXAM: CT HEAD WITHOUT CONTRAST TECHNIQUE: Contiguous axial images were obtained from the base of the skull through the vertex without intravenous contrast. COMPARISON:  MRI 1026 2019 FINDINGS: Brain: Normal anatomic configuration. Parenchymal volume loss is commensurate with the patient's age. Mild periventricular white matter changes are present likely reflecting the sequela of small vessel ischemia. Remote infarcts are noted within the left lentiform nucleus and corona radiata bilaterally. No abnormal intra or extra-axial mass lesion or fluid collection. No abnormal mass effect or midline shift. No evidence of acute intracranial hemorrhage or  infarct. Ventricular size is normal. Cerebellum unremarkable. Vascular: No asymmetric hyperdense vasculature at the skull base. Skull: Intact Sinuses/Orbits: Paranasal sinuses are clear. Ocular lenses have been removed. Orbits are otherwise unremarkable. Other: Mastoid air cells and middle ear cavities are clear. IMPRESSION: No acute intracranial abnormality.  No calvarial fracture. Stable senescent change and remote infarcts Electronically Signed   By: Fidela Salisbury M.D.   On: 01/15/2021 02:14   CT Angio Chest PE W and/or Wo Contrast  Result Date: 01/15/2021 CLINICAL DATA:  CHF. EXAM: CT ANGIOGRAPHY CHEST WITH CONTRAST TECHNIQUE: Multidetector CT imaging of the chest was performed using the standard protocol during bolus administration of intravenous contrast. Multiplanar CT image reconstructions and MIPs were obtained to evaluate the vascular anatomy. CONTRAST:  60mL OMNIPAQUE IOHEXOL 350 MG/ML SOLN COMPARISON:  None. FINDINGS: Cardiovascular: Satisfactory opacification of the pulmonary arteries to the segmental level. No evidence of pulmonary embolism. Enlarged heart size. No pericardial effusion. Bulky mitral annular ossification. Extensive atheromatous calcification of the aorta and coronaries. Aortic tortuosity. Mediastinum/Nodes: Negative for adenopathy or mass. Lungs/Pleura: Central airways are clear. Generalized  bronchial airway thickening with mild interlobular septal thickening and small, layering pleural effusions. Mosaic attenuation of the apical lungs which is likely from small airway collapse. Upper Abdomen: Cholelithiasis. Left nephrolithiasis and renal cystic density. Musculoskeletal: Generalized spondylosis with multi-level bridging osteophyte. Marked osteopenia. Review of the MIP images confirms the above findings. IMPRESSION: 1. Cardiomegaly and CHF including small pleural effusions. 2. Negative for pulmonary embolism. 3.  Aortic Atherosclerosis (ICD10-I70.0). 4. Cholelithiasis and left  nephrolithiasis. Electronically Signed   By: Jorje Guild M.D.   On: 01/15/2021 07:35   Results for orders placed or performed during the hospital encounter of 01/15/21  Resp Panel by RT-PCR (Flu A&B, Covid) Nasopharyngeal Swab     Status: Abnormal   Collection Time: 01/15/21  3:40 AM   Specimen: Nasopharyngeal Swab; Nasopharyngeal(NP) swabs in vial transport medium  Result Value Ref Range Status   SARS Coronavirus 2 by RT PCR POSITIVE (A) NEGATIVE Final    Comment: RESULT CALLED TO, READ BACK BY AND VERIFIED WITH: [place] [phone] 01/15/21 HNM (NOTE) SARS-CoV-2 target nucleic acids are DETECTED.  The SARS-CoV-2 RNA is generally detectable in upper respiratory specimens during the acute phase of infection. Positive results are indicative of the presence of the identified virus, but do not rule out bacterial infection or co-infection with other pathogens not detected by the test. Clinical correlation with patient history and other diagnostic information is necessary to determine patient infection status. The expected result is Negative.  Fact Sheet for Patients: EntrepreneurPulse.com.au  Fact Sheet for Healthcare Providers: IncredibleEmployment.be  This test is not yet approved or cleared by the [place] FDA and  has been authorized for detection and/or diagnosis of SARS-CoV-2 by FDA under an Emergency Use Authorization (EUA).  This EUA will remain in effect (meaning this test can be  used) for the duration of  the COVID-19 declaration under Section 564(b)(1) of the Act, 21 U.S.C. section 360bbb-3(b)(1), unless the authorization is terminated or revoked sooner.     Influenza A by PCR NEGATIVE NEGATIVE Final   Influenza B by PCR NEGATIVE NEGATIVE Final    Comment: (NOTE) The Xpert Xpress SARS-CoV-2/FLU/RSV plus assay is intended as an aid in the diagnosis of influenza from Nasopharyngeal swab specimens and should not be used as a  sole basis for treatment. Nasal washings and aspirates are unacceptable for Xpert Xpress SARS-CoV-2/FLU/RSV testing.  Fact Sheet for Patients: EntrepreneurPulse.com.au  Fact Sheet for Healthcare Providers: IncredibleEmployment.be  This test is not yet approved or cleared by the [place] FDA and has been authorized for detection and/or diagnosis of SARS-CoV-2 by FDA under an Emergency Use Authorization (EUA). This EUA will remain in effect (meaning this test can be used) for the duration of the COVID-19 declaration under Section 564(b)(1) of the Act, 21 U.S.C. section 360bbb-3(b)(1), unless the authorization is terminated or revoked.  Performed at [facility], [address] [place], [place] [address]     Signed:  Ezekiel Slocumb MD.  [facility] 01/16/2021, 9:36 AM

## 2021-01-16 NOTE — ED Notes (Signed)
Pt changed into gown and clean brief

## 2021-06-11 DEATH — deceased

## 2021-12-06 ENCOUNTER — Ambulatory Visit: Payer: Medicare Other | Admitting: Dermatology
